# Patient Record
Sex: Male | Born: 1985
Health system: Southern US, Community
[De-identification: ages and names within clinical notes are randomized; demographics above are authoritative.]

## PROBLEM LIST (undated history)

## (undated) DIAGNOSIS — K219 Gastro-esophageal reflux disease without esophagitis: Secondary | ICD-10-CM

## (undated) HISTORY — DX: Gastro-esophageal reflux disease without esophagitis: K21.9

---
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
## 2018-07-15 ENCOUNTER — Ambulatory Visit (HOSPITAL_COMMUNITY)
Admission: EM | Admit: 2018-07-15 | Discharge: 2018-07-15 | Disposition: A | Discharge status: Home / Self Care | Payer: Commercial Managed Care - PPO | Attending: Family | Admitting: Family

## 2018-07-15 ENCOUNTER — Encounter (HOSPITAL_COMMUNITY): Payer: Self-pay | Admitting: Emergency Medicine

## 2018-07-15 ENCOUNTER — Ambulatory Visit (INDEPENDENT_AMBULATORY_CARE_PROVIDER_SITE_OTHER): Payer: Commercial Managed Care - PPO

## 2018-07-15 DIAGNOSIS — S92355A Nondisplaced fracture of fifth metatarsal bone, left foot, initial encounter for closed fracture: Secondary | ICD-10-CM

## 2018-07-15 DIAGNOSIS — Y92838 Other recreation area as the place of occurrence of the external cause: Secondary | ICD-10-CM | POA: Diagnosis not present

## 2018-07-15 NOTE — Discharge Instructions (Addendum)
Recommend keep foot elevated and wear cam walker boot for support. May continue Ibuprofen 600mg  every 6 hours as needed for pain and swelling. Call your Podiatrist tomorrow AM to schedule appointment for follow-up or Call [facility] if unable to contact your Podiatrist for further evaluation and treatment.

## 2018-07-15 NOTE — ED Provider Notes (Signed)
[facility]    CSN: [id number] Arrival date & time: 07/15/18  1648     History   Chief Complaint Chief Complaint  Patient presents with  . Foot Pain    HPI Eric Espinoza is a 32 y.o. male.   32 year old male presents with injury to his left foot today. He was playing with his daughter at the playground when he rolled his left foot and ankle. He experienced some pain but noticed a few hours later that he has more swelling on the lateral area of his foot and concerned over possibility of fracture. He has applied ice and taken Ibuprofen with minimal relief. Denies any numbness or previous injury to area. No other chronic health issues. Takes no daily medication.   The history is provided by the patient.    History reviewed. No pertinent past medical history.  There are no active problems to display for this patient.   History reviewed. No pertinent surgical history.     Home Medications    Prior to Admission medications   Not on File    Family History History reviewed. No pertinent family history.  Social History Social History   Tobacco Use  . Smoking status: Never Smoker  . Smokeless tobacco: Never Used  Substance Use Topics  . Alcohol use: Not Currently  . Drug use: Not Currently     Allergies   Patient has no known allergies.   Review of Systems Review of Systems  Constitutional: Negative for activity change, chills, fatigue and fever.  Cardiovascular: Negative for chest pain and leg swelling.  Gastrointestinal: Negative for nausea and vomiting.  Musculoskeletal: Positive for arthralgias, gait problem and joint swelling.  Skin: Positive for color change. Negative for rash and wound.  Allergic/Immunologic: Negative for immunocompromised state.  Neurological: Negative for dizziness, tremors, syncope, weakness, light-headedness, numbness and headaches.  Hematological: Negative for adenopathy. Does not bruise/bleed easily.    Psychiatric/Behavioral: Negative.      Physical Exam Triage Vital Signs ED Triage Vitals  Enc Vitals Group     BP 07/15/18 1727 128/72     Pulse Rate 07/15/18 1727 86     Resp 07/15/18 1727 18     Temp 07/15/18 1727 (!) 97.2 F (36.2 C)     Temp Source 07/15/18 1727 Oral     SpO2 07/15/18 1727 99 %     Weight --      Height --      Head Circumference --      Peak Flow --      Pain Score 07/15/18 1728 5     Pain Loc --      Pain Edu? --      Excl. in GC? --    No data found.  Updated Vital Signs BP 128/72 (BP Location: Left Arm)   Pulse 86   Temp (!) 97.2 F (36.2 C) (Oral)   Resp 18   SpO2 99%   Visual Acuity Right Eye Distance:   Left Eye Distance:   Bilateral Distance:    Right Eye Near:   Left Eye Near:    Bilateral Near:     Physical Exam Vitals signs reviewed.  Constitutional:      General: He is not in acute distress.    Appearance: Normal appearance. He is well-developed, well-groomed and normal weight. He is not ill-appearing.  HENT:     Head: Normocephalic and atraumatic.  Eyes:     Extraocular Movements: Extraocular movements intact.  Conjunctiva/sclera: Conjunctivae normal.  Neck:     Musculoskeletal: Normal range of motion.  Cardiovascular:     Rate and Rhythm: Normal rate.  Pulmonary:     Effort: Pulmonary effort is normal.  Musculoskeletal:        General: Swelling, tenderness and signs of injury present.     Left foot: Normal range of motion and normal capillary refill. Tenderness and swelling present. No deformity or laceration.       Feet:     Comments: Has full range of motion of left foot and ankle but increased pain with internal rotation. Swelling present along the 4th and 5th metatarsal area of the left foot. Slight bruising present. Tender. No numbness or neuro deficits noted. Good capillary refill. No injury to ankle noted.   Skin:    General: Skin is warm and dry.     Capillary Refill: Capillary refill takes less than 2  seconds.     Findings: Bruising and signs of injury present. No laceration or wound.  Neurological:     General: No focal deficit present.     Mental Status: He is alert and oriented to person, place, and time.     Sensory: Sensation is intact. No sensory deficit.     Motor: Motor function is intact. No weakness.     Gait: Gait abnormal (difficulty bearing weight due to injury).  Psychiatric:        Mood and Affect: Mood normal.        Behavior: Behavior normal. Behavior is cooperative.        Thought Content: Thought content normal.        Judgment: Judgment normal.      UC Treatments / Results  Labs (all labs ordered are listed, but only abnormal results are displayed) Labs Reviewed - No data to display  EKG None  Radiology Dg Foot Complete Left  Result Date: 07/15/2018 CLINICAL DATA:  Left lateral foot pain, swelling EXAM: LEFT FOOT - COMPLETE 3+ VIEW COMPARISON:  None. FINDINGS: Nondisplaced fracture at the base of the left 5th metatarsal. No additional fracture. No subluxation or dislocation. IMPRESSION: Nondisplaced fracture at the base of the left 5th metatarsal. Electronically Signed   By: Charlett NoseKevin  Dover M.D.   On: 07/15/2018 18:02    Procedures Procedures (including critical care time)  Medications Ordered in UC Medications - No data to display  Initial Impression / Assessment and Plan / UC Course  I have reviewed the triage vital signs and the nursing notes.  Pertinent labs & imaging results that were available during my care of the patient were reviewed by me and considered in my medical decision making (see chart for details).    Reviewed x-ray results with patient- non-displaced fracture at base of 5th metatarsal. Discussed probable treatment. Recommend Cam walker boot for support. May continue Ibuprofen 600mg  every 6 hours as needed for pain and swelling. Encouraged to elevate foot as much as possible. Call his Podiatrist tomorrow (Monday) to schedule  appointment for follow-up. If unable to see his Podiatrist, may contact [facility] for further evaluation.  Final Clinical Impressions(s) / UC Diagnoses   Final diagnoses:  Nondisplaced fracture of fifth metatarsal bone, left foot, initial encounter for closed fracture     Discharge Instructions     Recommend keep foot elevated and wear cam walker boot for support. May continue Ibuprofen 600mg  every 6 hours as needed for pain and swelling. Call your Podiatrist tomorrow AM to schedule appointment  for follow-up or Call [facility] if unable to contact your Podiatrist for further evaluation and treatment.     ED Prescriptions    None     Controlled Substance Prescriptions [place] Controlled Substance Registry consulted? Not Applicable   Sudie Grumbling, NP 07/15/18 2215

## 2018-07-15 NOTE — ED Triage Notes (Signed)
Pt here for left foot pain after twisting ankle today

## 2019-07-18 ENCOUNTER — Other Ambulatory Visit: Payer: Self-pay

## 2019-07-19 ENCOUNTER — Encounter: Payer: Self-pay | Admitting: Family Medicine

## 2019-07-19 ENCOUNTER — Ambulatory Visit (INDEPENDENT_AMBULATORY_CARE_PROVIDER_SITE_OTHER): Payer: Commercial Managed Care - PPO | Admitting: Family Medicine

## 2019-07-19 ENCOUNTER — Ambulatory Visit: Payer: Commercial Managed Care - PPO | Admitting: Family Medicine

## 2019-07-19 VITALS — BP 120/78 | HR 95 | Temp 98.7°F | Ht 70.0 in | Wt 166.6 lb

## 2019-07-19 DIAGNOSIS — Z8042 Family history of malignant neoplasm of prostate: Secondary | ICD-10-CM | POA: Diagnosis not present

## 2019-07-19 DIAGNOSIS — G479 Sleep disorder, unspecified: Secondary | ICD-10-CM

## 2019-07-19 DIAGNOSIS — Z0000 Encounter for general adult medical examination without abnormal findings: Secondary | ICD-10-CM

## 2019-07-19 LAB — LIPID PANEL
Cholesterol: 126 mg/dL (ref 0–200)
HDL: 40.9 mg/dL (ref >39.00)
LDL Cholesterol: 71 mg/dL (ref 0–99)
NonHDL: 85.2
Total CHOL/HDL Ratio: 3
Triglycerides: 73 mg/dL (ref 0.0–149.0)
VLDL: 14.6 mg/dL (ref 0.0–40.0)

## 2019-07-19 LAB — COMPREHENSIVE METABOLIC PANEL
ALT: 16 U/L (ref 0–53)
AST: 24 U/L (ref 0–37)
Albumin: 4.6 g/dL (ref 3.5–5.2)
Alkaline Phosphatase: 68 U/L (ref 39–117)
BUN: 15 mg/dL (ref 6–23)
CO2: 31 mEq/L (ref 19–32)
Calcium: 10 mg/dL (ref 8.4–10.5)
Chloride: 103 mEq/L (ref 96–112)
Creatinine, Ser: 0.78 mg/dL (ref 0.40–1.50)
GFR: 114.35 mL/min (ref >60.00)
Glucose, Bld: 84 mg/dL (ref 70–99)
Potassium: 4.4 mEq/L (ref 3.5–5.1)
Sodium: 139 mEq/L (ref 135–145)
Total Bilirubin: 0.4 mg/dL (ref 0.2–1.2)
Total Protein: 7 g/dL (ref 6.0–8.3)

## 2019-07-19 LAB — CBC WITH DIFFERENTIAL/PLATELET
Basophils Absolute: 0 10*3/uL (ref 0.0–0.1)
Basophils Relative: 0.7 % (ref 0.0–3.0)
Eosinophils Absolute: 0.2 10*3/uL (ref 0.0–0.7)
Eosinophils Relative: 2.7 % (ref 0.0–5.0)
HCT: 41.2 % (ref 39.0–52.0)
Hemoglobin: 14.4 g/dL (ref 13.0–17.0)
Lymphocytes Relative: 32.9 % (ref 12.0–46.0)
Lymphs Abs: 1.9 10*3/uL (ref 0.7–4.0)
MCHC: 34.9 g/dL (ref 30.0–36.0)
MCV: 86.8 fl (ref 78.0–100.0)
Monocytes Absolute: 0.5 10*3/uL (ref 0.1–1.0)
Monocytes Relative: 9.2 % (ref 3.0–12.0)
Neutro Abs: 3.2 10*3/uL (ref 1.4–7.7)
Neutrophils Relative %: 54.5 % (ref 43.0–77.0)
Platelets: 186 10*3/uL (ref 150.0–400.0)
RBC: 4.75 Mil/uL (ref 4.22–5.81)
RDW: 12.7 % (ref 11.5–15.5)
WBC: 5.8 10*3/uL (ref 4.0–10.5)

## 2019-07-19 LAB — TSH: TSH: 1.81 u[IU]/mL (ref 0.35–4.50)

## 2019-07-19 NOTE — Progress Notes (Signed)
Subjective  CC:  Chief Complaint  Patient presents with  . New Patient (Initial Visit)    sleep problems    HPI: Eric Espinoza is a 33 y.o. male who presents to [facility] [place] today to establish care with me as a new patient.   He has the following concerns or needs:  Very pleasant healthy married father of a 50 yo daughter here to establish care. Had a complete physical last year - no medical problems or medications. Lives healthy lifestyle. Over last 2 weeks, will awaken with a jerk and will have a problem getting back to sleep. No chronic sleep problems. Had similar short term issue back in may. Denies stressors. Will lie awake at times worrying about if he will fall asleep soon. + snoring, no witnessed apnea. Normal mood. Happy.   Assessment  1. Annual physical exam   2. Sleep difficulties   3. Family history of prostate cancer      Plan   CPE: routine guidance given. Screening labs ordered. imms up to date.   Counseled on sleep hygiene and mgt strategies. F/u if worsens or persists.   Follow up:  Return in about 1 year (around 07/18/2020) for complete physical. Orders Placed This Encounter  Procedures  . CBC w/Diff  . CMP  . Lipids  . TSH  . HIV antibody   No orders of the defined types were placed in this encounter.    Depression screen [facility] 2/9 07/19/2019  Decreased Interest 0  Down, Depressed, Hopeless 0  PHQ - 2 Score 0  Altered sleeping 1  Tired, decreased energy 1  Change in appetite 0  Feeling bad or failure about yourself  0  Trouble concentrating 0  Moving slowly or fidgety/restless 0  Suicidal thoughts 0  PHQ-9 Score 2  Difficult doing work/chores Not difficult at all    We updated and reviewed the patient's past history in detail and it is documented below.  Patient Active Problem List   Diagnosis Date Noted  . Family history of prostate cancer 07/22/2019    Dad and uncle and Grandfather    Health Maintenance    Topic Date Due  . Samul Dada  07/18/2020 (Originally 02/22/2005)  . INFLUENZA VACCINE  Completed  . HIV Screening  Completed   Immunization History  Administered Date(s) Administered  . Influenza Inj Mdck Quad With Preservative 05/27/2019   No outpatient medications have been marked as taking for the 07/19/19 encounter (Office Visit) with Leamon Arnt, MD.    Allergies: Patient has No Known Allergies. Past Medical History Patient  has a past medical history of GERD (gastroesophageal reflux disease). Past Surgical History Patient  has a past surgical history that includes Appendectomy (1999). Family History: Patient family history includes Depression in his sister; Diabetes in his paternal grandfather; Healthy in his daughter; Hearing loss in his paternal grandfather; Heart disease in his paternal grandfather; High blood pressure in his paternal grandfather; Liver cancer in his mother; Mental illness in his paternal grandfather; Prostate cancer in his father, paternal grandfather, and paternal uncle. Social History:  Patient  reports that he has never smoked. He has never used smokeless tobacco. He reports previous alcohol use. He reports that he does not use drugs.  Review of Systems: Constitutional: negative for fever or malaise Ophthalmic: negative for photophobia, double vision or loss of vision Cardiovascular: negative for chest pain, dyspnea on exertion, or new LE swelling Respiratory: negative for SOB or persistent cough  Gastrointestinal: negative for abdominal pain, change in bowel habits or melena Genitourinary: negative for dysuria or gross hematuria Musculoskeletal: negative for new gait disturbance or muscular weakness Integumentary: negative for new or persistent rashes Neurological: negative for TIA or stroke symptoms Psychiatric: negative for SI or delusions Allergic/Immunologic: negative for hives  Patient Care Team    Relationship Specialty Notifications  Start End  Willow Ora, MD PCP - General Family Medicine  07/19/19     Objective  Vitals: BP 120/78 (BP Location: Right Arm, Patient Position: Sitting, Cuff Size: Normal)   Pulse 95   Temp 98.7 F (37.1 C) (Temporal)   Ht 5\' 10"  (1.778 m)   Wt 166 lb 9.6 oz (75.6 kg)   SpO2 98%   BMI 23.90 kg/m  General:  Well developed, well nourished, no acute distress  Psych:  Alert and oriented,normal mood and affect HEENT:  Normocephalic, atraumatic, non-icteric sclera, PERRL, oropharynx is without mass or exudate, supple neck without adenopathy, mass or thyromegaly Cardiovascular:  RRR without gallop, rub or murmur, nondisplaced PMI Respiratory:  Good breath sounds bilaterally, CTAB with normal respiratory effort Gastrointestinal: normal bowel sounds, soft, non-tender, no noted masses. No HSM MSK: no deformities, contusions. Joints are without erythema or swelling Skin:  Warm, no rashes or suspicious lesions noted Neurologic:    Mental status is normal. Gross motor and sensory exams are normal. Normal gait   Commons side effects, risks, benefits, and alternatives for medications and treatment plan prescribed today were discussed, and the patient expressed understanding of the given instructions. Patient is instructed to call or message via MyChart if he/she has any questions or concerns regarding our treatment plan. No barriers to understanding were identified. We discussed Red Flag symptoms and signs in detail. Patient expressed understanding regarding what to do in case of urgent or emergency type symptoms.   Medication list was reconciled, printed and provided to the patient in AVS. Patient instructions and summary information was reviewed with the patient as documented in the AVS. This note was prepared with assistance of Dragon voice recognition software. Occasional wrong-word or sound-a-like substitutions may have occurred due to the inherent limitations of voice recognition  software  This visit occurred during the SARS-CoV-2 public health emergency.  Safety protocols were in place, including screening questions prior to the visit, additional usage of staff PPE, and extensive cleaning of exam room while observing appropriate contact time as indicated for disinfecting solutions.

## 2019-07-19 NOTE — Patient Instructions (Signed)
Please return in 12 months for your annual complete physical; please come fasting.  Please sign up for Mychart. I have sent you a link via text. I will release your lab results to you on your MyChart account with further instructions. Please reply with any questions.    It was a pleasure meeting you today! Thank you for choosing [place] to meet your healthcare needs! I truly look forward to working with you. If you have any questions or concerns, please send me a message via Mychart or call the office at [phone].  Please do these things to maintain good health!   Exercise at least 30-45 minutes a day,  4-5 days a week.   Eat a low-fat diet with lots of fruits and vegetables, up to 7-9 servings per day.  Drink plenty of water daily. Try to drink 8 8oz glasses per day.  Seatbelts can save your life. Always wear your seatbelt.  Place Smoke Detectors on every level of your home and check batteries every year.  Eye Doctor - have an eye exam every 1-2 years  Safe sex - use condoms to protect yourself from STDs if you could be exposed to these types of infections.  Avoid heavy alcohol use. If you drink, keep it to less than 2 drinks/day and not every day.  [place].  Choose someone you trust that could speak for you if you became unable to speak for yourself.  Depression is common in our stressful world.If you're feeling down or losing interest in things you normally enjoy, please come in for a visit.   Insomnia Insomnia is a sleep disorder that makes it difficult to fall asleep or stay asleep. Insomnia can cause fatigue, low energy, difficulty concentrating, mood swings, and poor performance at work or school. There are three different ways to classify insomnia:  Difficulty falling asleep.  Difficulty staying asleep.  Waking up too early in the morning. Any type of insomnia can be long-term (chronic) or short-term (acute). Both are common. Short-term insomnia  usually lasts for three months or less. Chronic insomnia occurs at least three times a week for longer than three months. What are the causes? Insomnia may be caused by another condition, situation, or substance, such as:  Anxiety.  Certain medicines.  Gastroesophageal reflux disease (GERD) or other gastrointestinal conditions.  Asthma or other breathing conditions.  Restless legs syndrome, sleep apnea, or other sleep disorders.  Chronic pain.  Menopause.  Stroke.  Abuse of alcohol, tobacco, or illegal drugs.  Mental health conditions, such as depression.  Caffeine.  Neurological disorders, such as Alzheimer's disease.  An overactive thyroid (hyperthyroidism). Sometimes, the cause of insomnia may not be known. What increases the risk? Risk factors for insomnia include:  Gender. Women are affected more often than men.  Age. Insomnia is more common as you get older.  Stress.  Lack of exercise.  Irregular work schedule or working night shifts.  Traveling between different time zones.  Certain medical and mental health conditions. What are the signs or symptoms? If you have insomnia, the main symptom is having trouble falling asleep or having trouble staying asleep. This may lead to other symptoms, such as:  Feeling fatigued or having low energy.  Feeling nervous about going to sleep.  Not feeling rested in the morning.  Having trouble concentrating.  Feeling irritable, anxious, or depressed. How is this diagnosed? This condition may be diagnosed based on:  Your symptoms and medical history. Your health care  provider may ask about: ? Your sleep habits. ? Any medical conditions you have. ? Your mental health.  A physical exam. How is this treated? Treatment for insomnia depends on the cause. Treatment may focus on treating an underlying condition that is causing insomnia. Treatment may also include:  Medicines to help you sleep.  Counseling or  therapy.  Lifestyle adjustments to help you sleep better. Follow these instructions at home: Eating and drinking   Limit or avoid alcohol, caffeinated beverages, and cigarettes, especially close to bedtime. These can disrupt your sleep.  Do not eat a large meal or eat spicy foods right before bedtime. This can lead to digestive discomfort that can make it hard for you to sleep. Sleep habits   Keep a sleep diary to help you and your health care provider figure out what could be causing your insomnia. Write down: ? When you sleep. ? When you wake up during the night. ? How well you sleep. ? How rested you feel the next day. ? Any side effects of medicines you are taking. ? What you eat and drink.  Make your bedroom a dark, comfortable place where it is easy to fall asleep. ? Put up shades or blackout curtains to block light from outside. ? Use a white noise machine to block noise. ? Keep the temperature cool.  Limit screen use before bedtime. This includes: ? Watching TV. ? Using your smartphone, tablet, or computer.  Stick to a routine that includes going to bed and waking up at the same times every day and night. This can help you fall asleep faster. Consider making a quiet activity, such as reading, part of your nighttime routine.  Try to avoid taking naps during the day so that you sleep better at night.  Get out of bed if you are still awake after 15 minutes of trying to sleep. Keep the lights down, but try reading or doing a quiet activity. When you feel sleepy, go back to bed. General instructions  Take over-the-counter and prescription medicines only as told by your health care provider.  Exercise regularly, as told by your health care provider. Avoid exercise starting several hours before bedtime.  Use relaxation techniques to manage stress. Ask your health care provider to suggest some techniques that may work well for you. These may include: ? Breathing  exercises. ? Routines to release muscle tension. ? Visualizing peaceful scenes.  Make sure that you drive carefully. Avoid driving if you feel very sleepy.  Keep all follow-up visits as told by your health care provider. This is important. Contact a health care provider if:  You are tired throughout the day.  You have trouble in your daily routine due to sleepiness.  You continue to have sleep problems, or your sleep problems get worse. Get help right away if:  You have serious thoughts about hurting yourself or someone else. If you ever feel like you may hurt yourself or others, or have thoughts about taking your own life, get help right away. You can go to your nearest emergency department or call:  Your local emergency services (911 in the [place]  A suicide crisis helpline, such as the National Suicide Prevention Lifeline at 305-034-38491-936-629-4974. This is open 24 hours a day. Summary  Insomnia is a sleep disorder that makes it difficult to fall asleep or stay asleep.  Insomnia can be long-term (chronic) or short-term (acute).  Treatment for insomnia depends on the cause. Treatment may focus on treating an  underlying condition that is causing insomnia.  Keep a sleep diary to help you and your health care provider figure out what could be causing your insomnia. This information is not intended to replace advice given to you by your health care provider. Make sure you discuss any questions you have with your health care provider. Document Released: 07/15/2000 Document Revised: 06/30/2017 Document Reviewed: 04/27/2017 Elsevier Patient Education  2020 [employer].

## 2019-07-20 LAB — HIV ANTIBODY (ROUTINE TESTING W REFLEX): HIV 1&2 Ab, 4th Generation: NONREACTIVE

## 2019-07-22 ENCOUNTER — Encounter: Payer: Self-pay | Admitting: Family Medicine

## 2019-07-22 DIAGNOSIS — Z8042 Family history of malignant neoplasm of prostate: Secondary | ICD-10-CM | POA: Insufficient documentation

## 2019-07-22 NOTE — Progress Notes (Signed)
I have reviewed results. Normal. Patient notified by letter. Please see letter for details. 

## 2019-07-22 NOTE — Progress Notes (Signed)
Lab results mailed to patient in letter. Normal results. No action / follow up needed on these results.  

## 2019-09-18 ENCOUNTER — Ambulatory Visit: Payer: Commercial Managed Care - PPO | Admitting: Family Medicine

## 2019-10-09 ENCOUNTER — Encounter: Payer: Self-pay | Admitting: Family Medicine

## 2019-10-21 ENCOUNTER — Other Ambulatory Visit: Payer: Self-pay

## 2019-10-21 ENCOUNTER — Ambulatory Visit (INDEPENDENT_AMBULATORY_CARE_PROVIDER_SITE_OTHER): Payer: Commercial Managed Care - PPO | Admitting: Family Medicine

## 2019-10-21 ENCOUNTER — Encounter: Payer: Self-pay | Admitting: Family Medicine

## 2019-10-21 VITALS — BP 122/78 | HR 68 | Temp 98.0°F | Ht 70.0 in | Wt 164.4 lb

## 2019-10-21 DIAGNOSIS — M79642 Pain in left hand: Secondary | ICD-10-CM

## 2019-10-21 DIAGNOSIS — M79641 Pain in right hand: Secondary | ICD-10-CM | POA: Diagnosis not present

## 2019-10-21 DIAGNOSIS — G5621 Lesion of ulnar nerve, right upper limb: Secondary | ICD-10-CM

## 2019-10-21 NOTE — Patient Instructions (Signed)
Please follow up if symptoms do not improve or as needed.   

## 2019-10-21 NOTE — Progress Notes (Signed)
Subjective  CC:  Chief Complaint  Patient presents with  . Wrist Pain    bilateral wrist pain. chronic problem. flare up    HPI: Eric Espinoza is a 34 y.o. male who presents to the office today to address the problems listed above in the chief complaint.  34 yo male who works at [employer] on computer a lot c/o > 10 years of hand and wrist pain. He has seen several providers in the past but seems like mostly primary care or occupational med. Reports dx of "tendonitis". Has hypothenar tenderness, intermittent pain into index finger and rare but recurrent numbness in the fifth pinky. He was prescribed large wrist splints years ago and wears them daily while working! No nighttime sxs. He admits to intermittent medial elbow pain as well. He has used tylenol, advil, and stretches. 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 No prior injuries. No joint pain or swelling or redness or warmth. No new activities or overuse outside of typing on computer. Uses regular mouse and keyboard. Did note ttp on palm yesterday when pushing his daughter in the swing. No thumb pain. No other joint pain. sxs worse over last 2 weeks. Reports has had xrays in past that were unremarkable. Has never seen hand specialist or ortho or sports med.    Assessment  1. Bilateral hand pain   2. Ulnar neuropathy at elbow of right upper extremity      Plan   Hand pain:  Exam is most c/w mechanical soreness from keyboard and mouse use. No joint sxs or exam findings. Discussed adjusting ergonomics, getting a pad for hands and mouse (iMak bean bag pads), trial of advil and monitoring sxs more closely. Stop the braces as well. If not improving, then sports med or hand surgeon evaluation. No sxs of CTS. No inflammatory findings neither.   likley ulnar neuropathy causing intermittent pinky numbness. Decrease leaning on elbows. Further work up if not improving.   Follow up: prn  Visit date not found  No  orders of the defined types were placed in this encounter.  No orders of the defined types were placed in this encounter.     I reviewed the patients updated PMH, FH, and SocHx.    Patient Active Problem List   Diagnosis Date Noted  . Family history of prostate cancer 07/22/2019   No outpatient medications have been marked as taking for the 10/21/19 encounter (Office Visit) with Leamon Arnt, MD.    Allergies: Patient has No Known Allergies. Family History: Patient family history includes Depression in his sister; Diabetes in his paternal grandfather; Healthy in his daughter; Hearing loss in his paternal grandfather; Heart disease in his paternal grandfather; High blood pressure in his paternal grandfather; Liver cancer in his mother; Mental illness in his paternal grandfather; Prostate cancer in his father, paternal grandfather, and paternal uncle. Social History:  Patient  reports that he has never smoked. He has never used smokeless tobacco. He reports previous alcohol use. He reports that he does not use drugs.  Review of Systems: Constitutional: Negative for fever malaise or anorexia Cardiovascular: negative for chest pain Respiratory: negative for SOB or persistent cough Gastrointestinal: negative for abdominal pain  Objective  Vitals: BP 122/78 (BP Location: Right Arm, Patient Position: Sitting, Cuff Size: Normal)   Pulse 68   Temp 98 F (36.7 C) (Temporal)   Ht 5\' 10"  (1.778 m)   Wt 164 lb 6.4 oz (74.6  kg)   SpO2 97%   BMI 23.59 kg/m  General: no acute distress , A&Ox3 MSK: nl appearing joints and hands. Nl grip strength, interosseous, and wrist/forearms. ttp over bilateral hypothenar prominences w/o joint ttp. Neg finklestiens, neg phalens. Mild ttp over right medial epicondyle. No forearm mm ttp.    Commons side effects, risks, benefits, and alternatives for medications and treatment plan prescribed today were discussed, and the patient expressed understanding of  the given instructions. Patient is instructed to call or message via MyChart if he/she has any questions or concerns regarding our treatment plan. No barriers to understanding were identified. We discussed Red Flag symptoms and signs in detail. Patient expressed understanding regarding what to do in case of urgent or emergency type symptoms.   Medication list was reconciled, printed and provided to the patient in AVS. Patient instructions and summary information was reviewed with the patient as documented in the AVS. This note was prepared with assistance of Dragon voice recognition software. Occasional wrong-word or sound-a-like substitutions may have occurred due to the inherent limitations of voice recognition software  This visit occurred during the SARS-CoV-2 public health emergency.  Safety protocols were in place, including screening questions prior to the visit, additional usage of staff PPE, and extensive cleaning of exam room while observing appropriate contact time as indicated for disinfecting solutions.

## 2019-11-13 ENCOUNTER — Encounter: Payer: Self-pay | Admitting: Family Medicine

## 2020-03-01 ENCOUNTER — Encounter: Payer: Self-pay | Admitting: Family Medicine

## 2020-03-04 ENCOUNTER — Ambulatory Visit: Payer: Commercial Managed Care - PPO | Admitting: Family Medicine

## 2020-03-04 ENCOUNTER — Encounter: Payer: Self-pay | Admitting: Family Medicine

## 2020-03-04 ENCOUNTER — Other Ambulatory Visit: Payer: Self-pay

## 2020-03-04 VITALS — BP 114/80 | HR 87 | Temp 98.4°F | Resp 16 | Wt 166.0 lb

## 2020-03-04 DIAGNOSIS — M25572 Pain in left ankle and joints of left foot: Secondary | ICD-10-CM

## 2020-03-04 NOTE — Patient Instructions (Signed)
Please return in December 2021 for your physical.   Please go to our [facility] office to get your xrays done. You can walk in M-F between 8:30am- noon or 1pm - 5pm. Tell them you are there for xrays ordered by me. They will send me the results, then I will let you know the results with instructions.   Address: [address].  The Xray department is located in the basement.     If you have any questions or concerns, please don't hesitate to send me a message via MyChart or call the office at [phone]. Thank you for visiting with [place] today! It's our pleasure caring for you.

## 2020-03-04 NOTE — Progress Notes (Signed)
Subjective  CC:  Chief Complaint  Patient presents with  . Joint Swelling    x several months, denies any injuries    HPI: Eric Espinoza is a 34 y.o. male who presents to the office today to address the problems listed above in the chief complaint.  Healthy 34 year old male who was noticed tenderness to touch in his medial left ankle for the last 1 to 2 months.  He does not recall any injury.  No swelling, redness or bruising noted.  He is concerned about an broken bone.  He did break a foot for in the past and would like this checked out.  No pain with walking.  No other joint problems at this time. Assessment  1. Acute left ankle pain      Plan   Medial left ankle pain: Prominent malleolus, possible bony contusion.  Check x-ray to rule out other problems.  Reassured.  Trial of Tylenol or Advil.  Follow up: Return in about 4 months (around 07/04/2020) for complete physical.    Orders Placed This Encounter  Procedures  . DG Ankle Complete Left   No orders of the defined types were placed in this encounter.     I reviewed the patients updated PMH, FH, and SocHx.    Patient Active Problem List   Diagnosis Date Noted  . Family history of prostate cancer 07/22/2019   No outpatient medications have been marked as taking for the 03/04/20 encounter (Office Visit) with Willow Ora, MD.    Allergies: Patient has No Known Allergies. Family History: Patient family history includes Depression in his sister; Diabetes in his paternal grandfather; Healthy in his daughter; Hearing loss in his paternal grandfather; Heart disease in his paternal grandfather; High blood pressure in his paternal grandfather; Liver cancer in his mother; Mental illness in his paternal grandfather; Prostate cancer in his father, paternal grandfather, and paternal uncle. Social History:  Patient  reports that he has never smoked. He has never used smokeless tobacco. He reports previous alcohol use. He  reports that he does not use drugs.  Review of Systems: Constitutional: Negative for fever malaise or anorexia Cardiovascular: negative for chest pain Respiratory: negative for SOB or persistent cough Gastrointestinal: negative for abdominal pain  Objective  Vitals: BP 114/80   Pulse 87   Temp 98.4 F (36.9 C) (Temporal)   Resp 16   Wt 166 lb (75.3 kg)   SpO2 98%   BMI 23.82 kg/m  General: no acute distress , A&Ox3 Left ankle: Normal-appearing, medial malleolus is prominent and tender to pressure lateral or medial ligament tenderness, normal range of motion without pain.  Normal gait.  No ankle swelling.     Commons side effects, risks, benefits, and alternatives for medications and treatment plan prescribed today were discussed, and the patient expressed understanding of the given instructions. Patient is instructed to call or message via MyChart if he/she has any questions or concerns regarding our treatment plan. No barriers to understanding were identified. We discussed Red Flag symptoms and signs in detail. Patient expressed understanding regarding what to do in case of urgent or emergency type symptoms.   Medication list was reconciled, printed and provided to the patient in AVS. Patient instructions and summary information was reviewed with the patient as documented in the AVS. This note was prepared with assistance of Dragon voice recognition software. Occasional wrong-word or sound-a-like substitutions may have occurred due to the inherent limitations of voice recognition software  This visit occurred  during the SARS-CoV-2 public health emergency.  Safety protocols were in place, including screening questions prior to the visit, additional usage of staff PPE, and extensive cleaning of exam room while observing appropriate contact time as indicated for disinfecting solutions.

## 2020-03-27 ENCOUNTER — Ambulatory Visit (INDEPENDENT_AMBULATORY_CARE_PROVIDER_SITE_OTHER)
Admission: RE | Admit: 2020-03-27 | Discharge: 2020-03-27 | Disposition: A | Discharge status: Home / Self Care | Payer: Commercial Managed Care - PPO | Source: Ambulatory Visit | Attending: Family Medicine | Admitting: Family Medicine

## 2020-03-27 ENCOUNTER — Other Ambulatory Visit: Payer: Self-pay

## 2020-03-27 DIAGNOSIS — M25572 Pain in left ankle and joints of left foot: Secondary | ICD-10-CM

## 2020-04-29 ENCOUNTER — Ambulatory Visit: Payer: Commercial Managed Care - PPO | Admitting: Family Medicine

## 2020-06-17 ENCOUNTER — Emergency Department (HOSPITAL_COMMUNITY)
Admission: EM | Admit: 2020-06-17 | Discharge: 2020-06-18 | Disposition: A | Discharge status: Home / Self Care | Payer: Commercial Managed Care - PPO | Attending: Emergency Medicine | Admitting: Emergency Medicine

## 2020-06-17 ENCOUNTER — Encounter: Payer: Self-pay | Admitting: Family Medicine

## 2020-06-17 ENCOUNTER — Emergency Department (HOSPITAL_COMMUNITY): Payer: Commercial Managed Care - PPO

## 2020-06-17 ENCOUNTER — Other Ambulatory Visit: Payer: Self-pay

## 2020-06-17 ENCOUNTER — Encounter (HOSPITAL_COMMUNITY): Payer: Self-pay | Admitting: *Deleted

## 2020-06-17 DIAGNOSIS — M79675 Pain in left toe(s): Secondary | ICD-10-CM | POA: Diagnosis not present

## 2020-06-17 DIAGNOSIS — W228XXA Striking against or struck by other objects, initial encounter: Secondary | ICD-10-CM | POA: Insufficient documentation

## 2020-06-17 DIAGNOSIS — Y9389 Activity, other specified: Secondary | ICD-10-CM | POA: Diagnosis not present

## 2020-06-17 NOTE — ED Triage Notes (Signed)
Pain in left 5th toe after bumping into something tonight.

## 2020-06-18 NOTE — ED Notes (Signed)
Patient verbalizes understanding of discharge instructions. Opportunity for questioning and answers were provided. Armband removed by staff, pt discharged from ED stable & ambulatory  

## 2020-06-18 NOTE — ED Provider Notes (Signed)
[facility] EMERGENCY DEPARTMENT Provider Note   CSN: [id number] Arrival date & time: 06/17/20  2155     History Chief Complaint  Patient presents with  . Toe Pain    Eric Espinoza is a 34 y.o. male.  The history is provided by the patient.  Toe Pain This is a new problem. The current episode started 3 to 5 hours ago. The problem occurs constantly. The problem has not changed since onset.The symptoms are aggravated by walking. The symptoms are relieved by rest.   Patient bumped his toe while playing with his daughter.  Reports his left fifth toe hurts    Past Medical History:  Diagnosis Date  . GERD (gastroesophageal reflux disease)     Patient Active Problem List   Diagnosis Date Noted  . Family history of prostate cancer 07/22/2019    Past Surgical History:  Procedure Laterality Date  . APPENDECTOMY  1999       Family History  Problem Relation Age of Onset  . Liver cancer Mother   . Prostate cancer Father   . Depression Sister   . Prostate cancer Paternal Grandfather   . Diabetes Paternal Grandfather   . Hearing loss Paternal Grandfather   . Heart disease Paternal Grandfather   . High blood pressure Paternal Grandfather   . Mental illness Paternal Grandfather   . Healthy Daughter   . Prostate cancer Paternal Uncle     Social History   Tobacco Use  . Smoking status: Never Smoker  . Smokeless tobacco: Never Used  Vaping Use  . Vaping Use: Never used  Substance Use Topics  . Alcohol use: Not Currently  . Drug use: Never    Home Medications Prior to Admission medications   Not on File    Allergies    Patient has no known allergies.  Review of Systems   Review of Systems  Constitutional: Negative for fever.  Musculoskeletal: Positive for arthralgias and joint swelling.    Physical Exam Updated Vital Signs BP 136/80 (BP Location: Right Arm)   Pulse 81   Temp 97.8 F (36.6 C) (Oral)   Resp 16   SpO2 98%   Physical  Exam CONSTITUTIONAL: Well developed/well nourished HEAD: Normocephalic/atraumatic EYES: EOMI NECK: supple no meningeal signs LUNGS:  no apparent distress ABDOMEN: soft NEURO: Pt is awake/alert/appropriate, moves all extremitiesx4.  No facial droop.   EXTREMITIES: pulses normal/equal, full ROM, tenderness and swelling noted to left fifth toe.  No lacerations.  Nailbed is intact without subungual hematoma.  No obvious deformities.  No wounds noted to plantar surface of the foot. SKIN: warm, color normal PSYCH: no abnormalities of mood noted, alert and oriented to situation  ED Results / Procedures / Treatments   Labs (all labs ordered are listed, but only abnormal results are displayed) Labs Reviewed - No data to display  EKG None  Radiology DG Toe 5th Left  Result Date: 06/18/2020 CLINICAL DATA:  34 year old male status post left 5th toe injury. EXAM: DG TOE 5TH LEFT COMPARISON:  None. FINDINGS: Generalized soft tissue swelling. Underlying normal bone mineralization. Alignment is preserved in the left 5th toe. The 5th MTP joint is normal. Possible tiny ossific fragment along the lateral aspect of the 5th PIP joint which might reflect a tiny periarticular fracture. But otherwise the 5th phalanges are intact. IMPRESSION: Questionable tiny fracture fragment along the lateral 5th PIP, but otherwise intact left 5th toe phalanges. Electronically Signed   By: Althea Grimmer.D.  On: 06/18/2020 01:48    Procedures Procedures    Medications Ordered in ED Medications - No data to display  ED Course  I have reviewed the triage vital signs and the nursing notes.  Pertinent   imaging results that were available during my care of the patient were reviewed by me and considered in my medical decision making (see chart for details).    MDM Rules/Calculators/A&P                          Patient decided to leave the hospital prior to final results of x-ray.  Buddy tape was placed and patient was  given postop shoe and referred to orthopedics Final Clinical Impression(s) / ED Diagnoses Final diagnoses:  Toe pain, left    Rx / DC Orders ED Discharge Orders    None       Zadie Rhine, MD 06/18/20 [phone]

## 2020-09-01 ENCOUNTER — Other Ambulatory Visit: Payer: Self-pay

## 2020-09-01 ENCOUNTER — Ambulatory Visit (INDEPENDENT_AMBULATORY_CARE_PROVIDER_SITE_OTHER): Payer: Commercial Managed Care - PPO | Admitting: Family Medicine

## 2020-09-01 ENCOUNTER — Encounter: Payer: Self-pay | Admitting: Family Medicine

## 2020-09-01 VITALS — BP 114/68 | HR 74 | Temp 98.4°F | Resp 16 | Ht 70.0 in | Wt 166.0 lb

## 2020-09-01 DIAGNOSIS — Z1159 Encounter for screening for other viral diseases: Secondary | ICD-10-CM

## 2020-09-01 DIAGNOSIS — Z0000 Encounter for general adult medical examination without abnormal findings: Secondary | ICD-10-CM | POA: Diagnosis not present

## 2020-09-01 LAB — LIPID PANEL
Cholesterol: 147 mg/dL (ref 0–200)
HDL: 44.5 mg/dL (ref >39.00)
LDL Cholesterol: 78 mg/dL (ref 0–99)
NonHDL: 102.03
Total CHOL/HDL Ratio: 3
Triglycerides: 118 mg/dL (ref 0.0–149.0)
VLDL: 23.6 mg/dL (ref 0.0–40.0)

## 2020-09-01 LAB — CBC WITH DIFFERENTIAL/PLATELET
Basophils Absolute: 0 10*3/uL (ref 0.0–0.1)
Basophils Relative: 0.3 % (ref 0.0–3.0)
Eosinophils Absolute: 0.2 10*3/uL (ref 0.0–0.7)
Eosinophils Relative: 4 % (ref 0.0–5.0)
HCT: 42.1 % (ref 39.0–52.0)
Hemoglobin: 14.7 g/dL (ref 13.0–17.0)
Lymphocytes Relative: 33.5 % (ref 12.0–46.0)
Lymphs Abs: 1.5 10*3/uL (ref 0.7–4.0)
MCHC: 35 g/dL (ref 30.0–36.0)
MCV: 86.3 fl (ref 78.0–100.0)
Monocytes Absolute: 0.4 10*3/uL (ref 0.1–1.0)
Monocytes Relative: 8.7 % (ref 3.0–12.0)
Neutro Abs: 2.4 10*3/uL (ref 1.4–7.7)
Neutrophils Relative %: 53.5 % (ref 43.0–77.0)
Platelets: 178 10*3/uL (ref 150.0–400.0)
RBC: 4.88 Mil/uL (ref 4.22–5.81)
RDW: 13.1 % (ref 11.5–15.5)
WBC: 4.4 10*3/uL (ref 4.0–10.5)

## 2020-09-01 LAB — COMPREHENSIVE METABOLIC PANEL
ALT: 18 U/L (ref 0–53)
AST: 24 U/L (ref 0–37)
Albumin: 4.6 g/dL (ref 3.5–5.2)
Alkaline Phosphatase: 65 U/L (ref 39–117)
BUN: 16 mg/dL (ref 6–23)
CO2: 32 mEq/L (ref 19–32)
Calcium: 9.7 mg/dL (ref 8.4–10.5)
Chloride: 103 mEq/L (ref 96–112)
Creatinine, Ser: 0.78 mg/dL (ref 0.40–1.50)
GFR: 116.34 mL/min (ref >60.00)
Glucose, Bld: 81 mg/dL (ref 70–99)
Potassium: 4.1 mEq/L (ref 3.5–5.1)
Sodium: 138 mEq/L (ref 135–145)
Total Bilirubin: 0.6 mg/dL (ref 0.2–1.2)
Total Protein: 7.4 g/dL (ref 6.0–8.3)

## 2020-09-01 LAB — HEPATITIS C ANTIBODY
Hepatitis C Ab: NONREACTIVE
SIGNAL TO CUT-OFF: 0.01 (ref <1.00)

## 2020-09-01 NOTE — Progress Notes (Signed)
Subjective  Chief Complaint  Patient presents with  . Annual Exam    Non-fasting  . Health Maintenance    Declines Tdap at this time   HPI: Eric Espinoza is a 35 y.o. male who presents to [facility] [place] today for a Male Wellness Visit.   Wellness Visit: annual visit with health maintenance review and exam    Very healthy 35 year old male, married, 17-year-old daughter who is doing well.  No new concerns.  Educational hand pain discussed earlier and Voltaren topical gel helps.  He is due for his Tdap but will defer until Friday because he has had some myalgias in the past and would like to have it on a weekend.  Body mass index is 23.82 kg/m. Wt Readings from Last 3 Encounters:  09/01/20 166 lb (75.3 kg)  03/04/20 166 lb (75.3 kg)  10/21/19 164 lb 6.4 oz (74.6 kg)    Patient Active Problem List   Diagnosis Date Noted  . Family history of prostate cancer 07/22/2019   Health Maintenance  Topic Date Due  . Hepatitis C Screening  Never done  . TETANUS/TDAP  09/25/2020 (Originally 02/22/2005)  . INFLUENZA VACCINE  Completed  . COVID-19 Vaccine  Completed  . HIV Screening  Completed   Immunization History  Administered Date(s) Administered  . Influenza Inj Mdck Quad With Preservative 05/27/2019  . Influenza-Unspecified 06/01/2020  . PFIZER(Purple Top)SARS-COV-2 Vaccination 10/30/2019, 11/20/2019, 07/15/2020   We updated and reviewed the patient's past history in detail and it is documented below. Allergies: Patient has No Known Allergies. Past Medical History  has a past medical history of GERD (gastroesophageal reflux disease). Past Surgical History  has a past surgical history that includes Appendectomy (1999). Social History Patient  reports that he has never smoked. He has never used smokeless tobacco. He reports previous alcohol use. He reports that he does not use drugs. Family History Patient family history includes Depression in his sister;  Diabetes in his paternal grandfather; Healthy in his daughter; Hearing loss in his paternal grandfather; Heart disease in his paternal grandfather; High blood pressure in his paternal grandfather; Liver cancer in his mother; Mental illness in his paternal grandfather; Prostate cancer in his father, paternal grandfather, and paternal uncle. Review of Systems: Constitutional: negative for fever or malaise Ophthalmic: negative for photophobia, double vision or loss of vision Cardiovascular: negative for chest pain, dyspnea on exertion, or new LE swelling Respiratory: negative for SOB or persistent cough Gastrointestinal: negative for abdominal pain, change in bowel habits or melena Genitourinary: negative for dysuria or gross hematuria Musculoskeletal: negative for new gait disturbance or muscular weakness Integumentary: negative for new or persistent rashes, no breast lumps Neurological: negative for TIA or stroke symptoms Psychiatric: negative for SI or delusions Allergic/Immunologic: negative for hives  Patient Care Team    Relationship Specialty Notifications Start End  Willow Ora, MD PCP - General Family Medicine  07/19/19    Objective  Vitals: BP 114/68   Pulse 74   Temp 98.4 F (36.9 C) (Temporal)   Resp 16   Ht 5\' 10"  (1.778 m)   Wt 166 lb (75.3 kg)   SpO2 98%   BMI 23.82 kg/m  General:  Well developed, well nourished, no acute distress  Psych:  Alert and orientedx3,normal mood and affect HEENT:  Normocephalic, atraumatic, non-icteric sclera, PERRL, oropharynx is clear without mass or exudate, supple neck without adenopathy, mass or thyromegaly Cardiovascular:  Normal S1, S2, RRR without gallop, rub or  murmur, nondisplaced PMI, +2 distal pulses in bilateral upper and lower extremities. Respiratory:  Good breath sounds bilaterally, CTAB with normal respiratory effort Gastrointestinal: normal bowel sounds, soft, non-tender, no noted masses. No HSM MSK: no deformities,  contusions. Joints are without erythema or swelling. Spine and CVA region are nontender Skin:  Warm, no rashes or suspicious lesions noted Neurologic:    Mental status is normal. CN 2-11 are normal. Gross motor and sensory exams are normal. Stable gait. No tremor GU: No inguinal hernias or adenopathy are appreciated bilaterally  Assessment  1. Annual physical exam   2. Need for hepatitis C screening test      Plan  Male Wellness Visit:  Age appropriate Health Maintenance and Prevention measures were discussed with patient. Included topics are cancer screening recommendations, ways to keep healthy (see AVS) including dietary and exercise recommendations, regular eye and dental care, use of seat belts, and avoidance of moderate alcohol use and tobacco use.   BMI: discussed patient's BMI and encouraged positive lifestyle modifications to help get to or maintain a target BMI.  HM needs and immunizations were addressed and ordered. See below for orders. See HM and immunization section for updates.  He will schedule nurse visit for Tdap  Routine labs and screening tests ordered including cmp, cbc and lipids where appropriate.  Discussed recommendations regarding Vit D and calcium supplementation (see AVS)  Follow up: Return in about 1 year (around 09/01/2021) for complete physical.   Commons side effects, risks, benefits, and alternatives for medications and treatment plan prescribed today were discussed, and the patient expressed understanding of the given instructions. Patient is instructed to call or message via MyChart if he/she has any questions or concerns regarding our treatment plan. No barriers to understanding were identified. We discussed Red Flag symptoms and signs in detail. Patient expressed understanding regarding what to do in case of urgent or emergency type symptoms.   Medication list was reconciled, printed and provided to the patient in AVS. Patient instructions and summary  information was reviewed with the patient as documented in the AVS. This note was prepared with assistance of Dragon voice recognition software. Occasional wrong-word or sound-a-like substitutions may have occurred due to the inherent limitations of voice recognition software This visit occurred during the SARS-CoV-2 public health emergency.  Safety protocols were in place, including screening questions prior to the visit, additional usage of staff PPE, and extensive cleaning of exam room while observing appropriate contact time as indicated for disinfecting solutions.   Orders Placed This Encounter  Procedures  . CBC with Differential/Platelet  . Comprehensive metabolic panel  . Lipid panel  . Hepatitis C antibody   No orders of the defined types were placed in this encounter.

## 2020-09-01 NOTE — Patient Instructions (Addendum)
Please return in 12 months for your annual complete physical; please come fasting. You may schedule a nurse visit to update your Tdap vaccine when you'd like on a Friday afternoon or when convenient.   I will release your lab results to you on your MyChart account with further instructions. Please reply with any questions.   If you have any questions or concerns, please don't hesitate to send me a message via MyChart or call the office at [phone]. Thank you for visiting with [place] today! It's our pleasure caring for you.  Please do these things to maintain good health!   Exercise at least 30-45 minutes a day,  4-5 days a week.   Eat a low-fat diet with lots of fruits and vegetables, up to 7-9 servings per day.  Drink plenty of water daily. Try to drink 8 8oz glasses per day.  Seatbelts can save your life. Always wear your seatbelt.  Place Smoke Detectors on every level of your home and check batteries every year.  Eye Doctor - have an eye exam every 1-2 years  Safe sex - use condoms to protect yourself from STDs if you could be exposed to these types of infections.  Avoid heavy alcohol use. If you drink, keep it to less than 2 drinks/day and not every day.  Health Care Power of Attorney.  Choose someone you trust that could speak for you if you became unable to speak for yourself.  Depression is common in our stressful world.If you're feeling down or losing interest in things you normally enjoy, please come in for a visit.

## 2021-01-14 ENCOUNTER — Encounter: Payer: Self-pay | Admitting: Family Medicine

## 2021-01-15 ENCOUNTER — Other Ambulatory Visit: Payer: Self-pay

## 2021-01-15 ENCOUNTER — Telehealth: Payer: Self-pay

## 2021-01-15 DIAGNOSIS — M2550 Pain in unspecified joint: Secondary | ICD-10-CM

## 2021-01-15 NOTE — Telephone Encounter (Signed)
Patient states he has missed a phone call.    I have advised patient that he was referred to [facility].  I have given patient the phone number to call to get scheduled.    If there is anything else patient needs to be notified, please follow up in regard.

## 2021-02-04 NOTE — Progress Notes (Signed)
Subjective:    I'm seeing this patient as a consultation for: [employer]. Note will be routed back to referring provider/PCP.  CC: Arthralgia  I, Christoper Fabian, LAT, ATC, am serving as scribe for Dr. Clementeen Graham.  HPI: Pt is a 35 y/o male c/o pain in multiple areas. Pt c/o experiencing multiple minor injuries from running and using an exercise bike; knee pain, muscle pain/strains etc. Today, pt reports R h/s and calf pain since April 2022 after doing a lot of riding on an exercise bike.  He states that this pain has improved but is still noticing the symptoms especially w/ the cycling motion.  He then started doing some running which wasn't bothering his R h/s and calf but noticed some B knee pain.  He reports a hx of B knee pain w/ running over the years and wants to make sure he's not doing anything wrong in terms of form to explain the knee pain.  He tried Voltaren gel and stretching for his R h/s and calf.  Dx imaging: 06/17/20 5th L toe XR  03/27/20 L ankle XR  07/15/18 L foot XR  Past medical history, Surgical history, Family history, Social history, Allergies, and medications have been entered into the medical record, reviewed.   Review of Systems: No new headache, visual changes, nausea, vomiting, diarrhea, constipation, dizziness, abdominal pain, skin rash, fevers, chills, night sweats, weight loss, swollen lymph nodes, body aches, joint swelling, muscle aches, chest pain, shortness of breath, mood changes, visual or auditory hallucinations.   Objective:    Vitals:   02/05/21 1024  BP: 106/82  Pulse: 80  SpO2: 97%   General: Well Developed, well nourished, and in no acute distress.  Neuro/Psych: Alert and oriented x3, extra-ocular muscles intact, able to move all 4 extremities, sensation grossly intact. Skin: Warm and dry, no rashes noted.  Respiratory: Not using accessory muscles, speaking in full sentences, trachea midline.  Cardiovascular: Pulses palpable, no  extremity edema. Abdomen: Does not appear distended. MSK: L-spine nontender midline normal lumbar motion.  Negative slump test.  Lower extremity strength is intact except noted below.  Reflexes are intact and equal bilaterally.  Right hamstring normal.  Minimally tender normal strength negative H test.  Right calf normal.  Nontender normal strength.  Bilateral knees normal-appearing with exception of minimal VMO decreased bulk.  Nontender normal motion.  Hip abduction strength slightly decreased bilaterally.    Impression and Recommendations:    Assessment and Plan: 35 y.o. male with right hamstring and calf strain after heavy duty exercise bike riding injury and February.  Fundamentally I think he suffered a bit of a muscle strain and has not done a lot of effective rehab to get a better.  I think would do great with PT.  PT referral placed today.  Recheck in 6 weeks or so if not improved.  Chronic bilateral anterior knee pain with running.  Classic for patellofemoral pain syndrome.  He is a bit of VMO atrophy relative to his otherwise excellent leg strength and has a little bit of hip abductor weakness as well.  He should do very well again with PT.  PT referral placed today.Marland Kitchen  PDMP not reviewed this encounter. Orders Placed This Encounter  Procedures   Ambulatory referral to Physical Therapy    Referral Priority:   Routine    Referral Type:   Physical Medicine    Referral Reason:   Specialty Services Required    Requested Specialty:   Physical  Therapy    Number of Visits Requested:   1   No orders of the defined types were placed in this encounter.   Discussed warning signs or symptoms. Please see discharge instructions. Patient expresses understanding.   The above documentation has been reviewed and is accurate and complete Lynne Leader, M.D.

## 2021-02-05 ENCOUNTER — Other Ambulatory Visit: Payer: Self-pay

## 2021-02-05 ENCOUNTER — Ambulatory Visit: Payer: Commercial Managed Care - PPO | Admitting: Family Medicine

## 2021-02-05 ENCOUNTER — Encounter: Payer: Self-pay | Admitting: Family Medicine

## 2021-02-05 ENCOUNTER — Ambulatory Visit: Payer: Self-pay

## 2021-02-05 VITALS — BP 106/82 | HR 80 | Ht 70.0 in | Wt 161.4 lb

## 2021-02-05 DIAGNOSIS — M222X1 Patellofemoral disorders, right knee: Secondary | ICD-10-CM

## 2021-02-05 DIAGNOSIS — S86811A Strain of other muscle(s) and tendon(s) at lower leg level, right leg, initial encounter: Secondary | ICD-10-CM | POA: Diagnosis not present

## 2021-02-05 DIAGNOSIS — M79604 Pain in right leg: Secondary | ICD-10-CM

## 2021-02-05 DIAGNOSIS — M222X2 Patellofemoral disorders, left knee: Secondary | ICD-10-CM

## 2021-02-05 DIAGNOSIS — S76311A Strain of muscle, fascia and tendon of the posterior muscle group at thigh level, right thigh, initial encounter: Secondary | ICD-10-CM | POA: Diagnosis not present

## 2021-02-05 NOTE — Patient Instructions (Signed)
Thank you for coming in today.   I've referred you to Physical Therapy.  Let us know if you don't hear from them in one week.   Please use Voltaren gel (Generic Diclofenac Gel) up to 4x daily for pain as needed.  This is available over-the-counter as both the name brand Voltaren gel and the generic diclofenac gel.   Recheck in about 6 weeks especially if not improved.   Let me know if this is not working or if you have a problem.

## 2021-02-15 ENCOUNTER — Other Ambulatory Visit: Payer: Self-pay

## 2021-02-15 ENCOUNTER — Ambulatory Visit: Payer: Commercial Managed Care - PPO | Admitting: Physical Therapy

## 2021-02-15 DIAGNOSIS — M25561 Pain in right knee: Secondary | ICD-10-CM

## 2021-02-15 DIAGNOSIS — M25562 Pain in left knee: Secondary | ICD-10-CM

## 2021-02-15 DIAGNOSIS — G8929 Other chronic pain: Secondary | ICD-10-CM | POA: Diagnosis not present

## 2021-02-15 DIAGNOSIS — M79604 Pain in right leg: Secondary | ICD-10-CM

## 2021-02-16 ENCOUNTER — Encounter: Payer: Self-pay | Admitting: Physical Therapy

## 2021-02-16 NOTE — Patient Instructions (Signed)
Access Code: [email or web address] URL: [email or web address] Date: 02/16/2021 Prepared by: Sedalia Muta  Exercises Gastroc Stretch on Wall - 2 x daily - 3 reps - 30 hold Standing Ankle Dorsiflexion Stretch - 2 x daily - 3 reps - 30 hold Seated Hamstring Stretch - 2 x daily - 3 reps - 30 hold

## 2021-02-16 NOTE — Therapy (Signed)
[facility] [address] [place], [place], [address] Phone: [phone]   Fax:  [phone]  Physical Therapy Evaluation  Patient Details  Name: Eric Espinoza MRN: [id number] Date of Birth: 1986/07/10 Referring Provider (PT): Clementeen Graham   Encounter Date: 02/15/2021   PT End of Session - 02/16/21 1354     Visit Number 1    Number of Visits 12    Date for PT Re-Evaluation 03/29/21    Authorization Type UHC    PT Start Time 1215    PT Stop Time 1255    PT Time Calculation (min) 40 min    Activity Tolerance Patient tolerated treatment well    Behavior During Therapy [facility] for tasks assessed/performed             Past Medical History:  Diagnosis Date   GERD (gastroesophageal reflux disease)     Past Surgical History:  Procedure Laterality Date   APPENDECTOMY  1999    There were no vitals filed for this visit.    Subjective Assessment - 02/16/21 1344     Subjective Pt notes increased pain in R leg since April, after using Stationary bike(upright bike) . Had pain in back of knee, in calf and hamstring, has improved some, but  still unable to cycle or squat., Is able to run without pain in posterior LE. BUt notes some increased anterior knee pain with consistent running. Is able to run about 2 mi at this time. Pt would like to be able to be more consistent with exercise, feels pain is limiting at times. 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    Limitations Lifting;House hold activities    Patient Stated Goals decreased pain in leg and knees, return to activity without pain    Currently in Pain? Yes    Pain Score 3     Pain Location Knee    Pain Orientation Right;Left    Pain Descriptors / Indicators Aching    Pain Type Chronic pain    Pain Onset More than a month ago    Pain Frequency Intermittent    Aggravating Factors  running    Pain Relieving Factors rest    Multiple Pain Sites Yes     Pain Score 3    Pain Location Leg    Pain Orientation Right    Pain Descriptors / Indicators Tightness;Sore    Pain Type Acute pain    Pain Onset More than a month ago    Pain Frequency Intermittent    Aggravating Factors  cycling, deeper bend, squat                [facility] PT Assessment - 02/16/21 0001       Assessment   Medical Diagnosis R Leg pain    Referring Provider (PT) Clementeen Graham    Prior Therapy no      Precautions   Precautions None      Balance Screen   Has the patient fallen in the past 6 months No      Prior Function   Level of Independence Independent      Cognition   Overall Cognitive Status Within Functional Limits for tasks assessed      Posture/Postural Control   Posture Comments Standing: flat foot posture bilaterally,      ROM / Strength   AROM / PROM / Strength AROM;Strength      AROM   Overall AROM Comments lumbar: WFL,  hips: WFL, knees: WFL      Strength   Overall Strength Comments Hips: 4/5 to 4+/5, Knees: 4+/5      Palpation   Palpation comment No pain to palpate anterior/medial knees today, (but states pain here with activity),  Does have tenderness in mid- proximal gastroc (with trigger points) , and mid/distal hamstring. No pain with deep palpation of posterior knee.      Special Tests   Other special tests mild pain with HS stretch, mild reproduction of pain with gastroc stretch.      Ambulation/Gait   Gait Comments SLS  To be tested                        Objective measurements completed on examination: See above findings.       [facility] Adult PT Treatment/Exercise - 02/16/21 0001       Exercises   Exercises Knee/Hip      Knee/Hip Exercises: Stretches   Active Hamstring Stretch 3 reps;30 seconds    Active Hamstring Stretch Limitations seated    Gastroc Stretch 3 reps;30 seconds;Both    Gastroc Stretch Limitations at wall    Soleus Stretch 2 reps;30 seconds    Soleus Stretch Limitations at wall                     PT Education - 02/16/21 1354     Education Details PT POC, Exam findings, HEP    Person(s) Educated Patient    Methods Explanation;Demonstration;Tactile cues;Verbal cues;Handout    Comprehension Verbalized understanding;Returned demonstration;Verbal cues required;Tactile cues required;Need further instruction              PT Short Term Goals - 02/16/21 1405       PT SHORT TERM GOAL #1   Title Pt to be independent with initial HEP    Time 2    Period Weeks    Status New    Target Date 03/01/21               PT Long Term Goals - 02/16/21 1405       PT LONG TERM GOAL #1   Title Pt to be independent with final HEP    Time 6    Period Weeks    Status New    Target Date 03/29/21      PT LONG TERM GOAL #2   Title Pt to demo ability for squat (with optimal mechanics), bend, and cycling motions to be WNL and pain free, to improve ability for community and functional activities.    Time 6    Period Weeks    Status New    Target Date 03/29/21      PT LONG TERM GOAL #3   Title Pt to report ability for running, up to 3 miles, without anterior knee pain    Time 6    Period Weeks    Status New    Target Date 03/29/21                    Plan - 02/16/21 1409     Clinical Impression Statement Pt presents with primary complaint of increased pain in R leg, posteriorly in hamstring and gastroc. Pt with ongoing diffiuclty with knee flexion, cycling, and squats due to pain. Pt with decreased ability for exercise and sustaining exercise routine due to pain and deficits. He also has chronic reocurring pain in bil anterior knees with running. He  has lack of effective HEP for his diagnosis, and will benefit from strengthening and mobility program. Pt also with flat foot posture bilaterally, will bring in running shoes to assess next visit. Pt with compensatory supination, seen in current shoes, very worn on lateral heel. Pt with decreased ability for  full functional activities due to pain, and will benefit from skilled PT to improve .    Examination-Activity Limitations Squat;Bend;Stairs;Lift    Examination-Participation Restrictions Cleaning;Community Activity;Yard Work    Stability/Clinical Decision Making Stable/Uncomplicated    Clinical Decision Making Low    Rehab Potential Good    PT Frequency 2x / week    PT Duration 6 weeks    PT Treatment/Interventions ADLs/Self Care Home Management;Cryotherapy;Electrical Stimulation;Iontophoresis 4mg /ml Dexamethasone;Moist Heat;Balance training;Therapeutic exercise;Therapeutic activities;Functional mobility training;Stair training;Gait training;DME Instruction;Ultrasound;Neuromuscular re-education;Patient/family education;Orthotic Fit/Training;Manual techniques;Passive range of motion;Dry needling;Taping;Vasopneumatic Device;Spinal Manipulations;Joint Manipulations    PT Home Exercise Plan [email or web address]    Consulted and Agree with Plan of Care Patient             Patient will benefit from skilled therapeutic intervention in order to improve the following deficits and impairments:  Decreased activity tolerance, Pain, Decreased strength, Decreased mobility, Increased muscle spasms, Decreased range of motion, Improper body mechanics, Impaired flexibility  Visit Diagnosis: Pain in right leg  Chronic pain of left knee  Chronic pain of right knee     Problem List Patient Active Problem List   Diagnosis Date Noted   Family history of prostate cancer 07/22/2019   07/24/2019, PT, DPT 2:15 PM  02/16/21    [facility] [address] [place], [place], [place] Phone: [phone]   Fax:  [phone]  Name: Eric Espinoza MRN: Delma Officer Date of Birth: 12/20/1985

## 2021-02-24 ENCOUNTER — Other Ambulatory Visit: Payer: Self-pay

## 2021-02-24 ENCOUNTER — Ambulatory Visit (INDEPENDENT_AMBULATORY_CARE_PROVIDER_SITE_OTHER): Payer: Commercial Managed Care - PPO | Admitting: Physical Therapy

## 2021-02-24 ENCOUNTER — Encounter: Payer: Self-pay | Admitting: Physical Therapy

## 2021-02-24 DIAGNOSIS — M25562 Pain in left knee: Secondary | ICD-10-CM

## 2021-02-24 DIAGNOSIS — M79604 Pain in right leg: Secondary | ICD-10-CM

## 2021-02-24 DIAGNOSIS — M25561 Pain in right knee: Secondary | ICD-10-CM | POA: Diagnosis not present

## 2021-02-24 DIAGNOSIS — G8929 Other chronic pain: Secondary | ICD-10-CM

## 2021-02-24 NOTE — Patient Instructions (Signed)
Access Code: [email or web address] URL: [email or web address] Date: 02/24/2021 Prepared by: Sedalia Muta  Exercises Standing Bilateral Heel Raise on Step - 1 x daily - 2 sets - 10 reps Gastroc Stretch on Wall - 2 x daily - 3 reps - 30 hold Standing Ankle Dorsiflexion Stretch - 2 x daily - 3 reps - 30 hold Seated Hamstring Stretch - 2 x daily - 3 reps - 30 hold Mini Squat - 1 x daily - 1-2 sets - 10 reps Single Leg Stance - 2 x daily - 3 reps - 30 hold The Diver - 1 x daily - 1-2 sets - 10 reps

## 2021-02-24 NOTE — Therapy (Addendum)
[facility] [address] [place], [place], [address] Phone: [phone]   Fax:  [phone]  Physical Therapy Treatment  Patient Details  Name: Eric Espinoza MRN: [id number] Date of Birth: 12-Mar-1986 Referring Provider (PT): Lynne Leader   Encounter Date: 02/24/2021   PT End of Session - 02/24/21 0910     Visit Number 2    Number of Visits 12    Date for PT Re-Evaluation 03/29/21    Authorization Type UHC    PT Start Time 0803    PT Stop Time 0858    PT Time Calculation (min) 55 min    Activity Tolerance Patient tolerated treatment well    Behavior During Therapy [facility] for tasks assessed/performed             Past Medical History:  Diagnosis Date   GERD (gastroesophageal reflux disease)     Past Surgical History:  Procedure Laterality Date   APPENDECTOMY  1999    There were no vitals filed for this visit.   Subjective Assessment - 02/24/21 0822     Subjective Pt has been doing HEP. He has noticed continued pain in R calf, less pain in hamstring.    Currently in Pain? Yes    Pain Score 0-No pain    Pain Location Knee    Pain Orientation Right;Left    Pain Score 3    Pain Location Calf    Pain Orientation Right    Pain Descriptors / Indicators Sore    Pain Type Acute pain    Pain Onset More than a month ago    Pain Frequency Intermittent    Aggravating Factors  cycling, deeper bendd, squat                               [facility] Adult PT Treatment/Exercise - 02/24/21 0001       Ambulation/Gait   Gait Comments SLS: increased sway and decreased time on R vs L.      Posture/Postural Control   Posture Comments Standing: flat foot posture bilaterally,      Exercises   Exercises Knee/Hip      Knee/Hip Exercises: Stretches   Active Hamstring Stretch 3 reps;30 seconds    Active Hamstring Stretch Limitations seated    Soleus Stretch 2 reps;30 seconds    Soleus Stretch Limitations at wall       Knee/Hip Exercises: Aerobic   Recumbent Bike L2 x 8 min ;      Knee/Hip Exercises: Standing   Heel Raises 20 reps    Heel Raises Limitations Eccentric at step    Functional Squat 20 reps    Functional Squat Limitations x20 reg;  x15 with 25 lb, education on form    SLS 30 sec x 2 bil;    SLS with Vectors SLS with fwd reach and hip hinge 2x 5 bil;      Knee/Hip Exercises: Seated   Hamstring Curl 20 reps;Both    Hamstring Limitations Blue TB    Sit to [employer] 10 reps                      PT Short Term Goals - 02/16/21 1405       PT SHORT TERM GOAL #1   Title Pt to be independent with initial HEP    Time 2    Period Weeks    Status New  Target Date 03/01/21               PT Long Term Goals - 02/16/21 1405       PT LONG TERM GOAL #1   Title Pt to be independent with final HEP    Time 6    Period Weeks    Status New    Target Date 03/29/21      PT LONG TERM GOAL #2   Title Pt to demo ability for squat (with optimal mechanics), bend, and cycling motions to be WNL and pain free, to improve ability for community and functional activities.    Time 6    Period Weeks    Status New    Target Date 03/29/21      PT LONG TERM GOAL #3   Title Pt to report ability for running, up to 3 miles, without anterior knee pain    Time 6    Period Weeks    Status New    Target Date 03/29/21                   Plan - 02/24/21 0911     Clinical Impression Statement Pt reports most pain being in calf vs hamstring this week. Today pt was able to perform exercise bike as well as HS and calf activation without increased pain. Education and practice for optimal squat mechanics, pt with much improved ablity and mechanics after practice. Noted decreased stability in R vs L with SLS and SL reach activity. HEP updated to include todays activities. Pt wearing NB trail shoe with orthotic. Discussed optimal footwear for foot and knee positioning, given footwear recommnedation  for stability shoe. Plan to progress Dead lift motion and weight next visit, as well as address trigger points in R gastroc.    Examination-Activity Limitations Squat;Bend;Stairs;Lift    Examination-Participation Restrictions Cleaning;Community Activity;Yard Work    Stability/Clinical Decision Making Stable/Uncomplicated    Rehab Potential Good    PT Frequency 2x / week    PT Duration 6 weeks    PT Treatment/Interventions ADLs/Self Care Home Management;Cryotherapy;Electrical Stimulation;Iontophoresis 33m/ml Dexamethasone;Moist Heat;Balance training;Therapeutic exercise;Therapeutic activities;Functional mobility training;Stair training;Gait training;DME Instruction;Ultrasound;Neuromuscular re-education;Patient/family education;Orthotic Fit/Training;Manual techniques;Passive range of motion;Dry needling;Taping;Vasopneumatic Device;Spinal Manipulations;Joint Manipulations    PT Home Exercise Plan [email or web address]    Consulted and Agree with Plan of Care Patient             Patient will benefit from skilled therapeutic intervention in order to improve the following deficits and impairments:  Decreased activity tolerance, Pain, Decreased strength, Decreased mobility, Increased muscle spasms, Decreased range of motion, Improper body mechanics, Impaired flexibility  Visit Diagnosis: Pain in right leg  Chronic pain of left knee  Chronic pain of right knee     Problem List Patient Active Problem List   Diagnosis Date Noted   Family history of prostate cancer 07/22/2019   LLyndee Hensen PT, DPT 9:19 AM  02/24/21    [facility] [address]Phone: [phone]  Fax:  [phone] Name: Eric HopkinMRN: [id number] of Birth: [birth year] PHYSICAL THERAPY DISCHARGE SUMMARY  Visits from Start of Care: 2 Plan: Patient agrees to discharge.  Patient goals were partially met. Patient is being discharged due to -  not returning since last visit       LLyndee Hensen PT, DPT 11:04 AM  08/03/21

## 2021-03-03 ENCOUNTER — Encounter: Payer: Commercial Managed Care - PPO | Admitting: Physical Therapy

## 2021-03-15 ENCOUNTER — Ambulatory Visit: Payer: Commercial Managed Care - PPO | Admitting: Family Medicine

## 2021-03-17 IMAGING — CR DG TOE 5TH 2+V*L*
6 series · 6 of 6 positions shown · non-contrast
Comparison: None.

CLINICAL DATA: 34-year-old male status post left 5th toe injury.

EXAM:
DG TOE 5TH LEFT

[toe ap (1 of 2)]
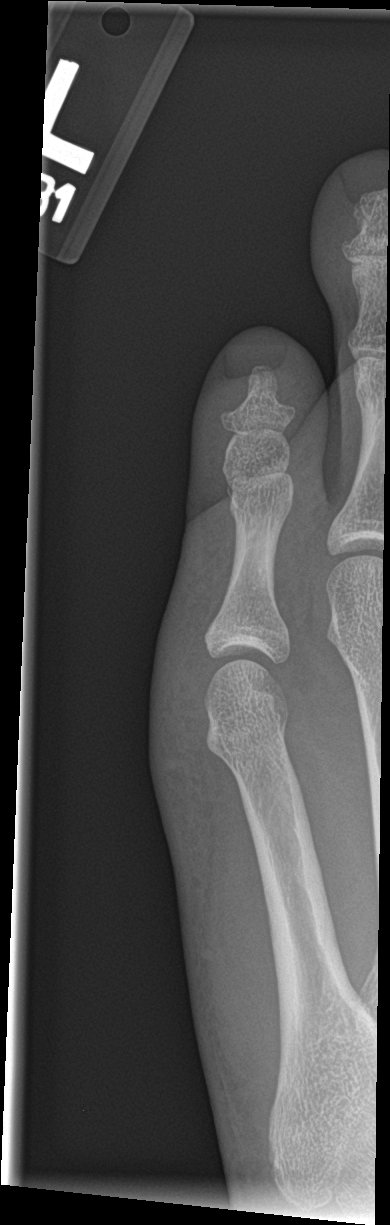

[toe ap (2 of 2)]
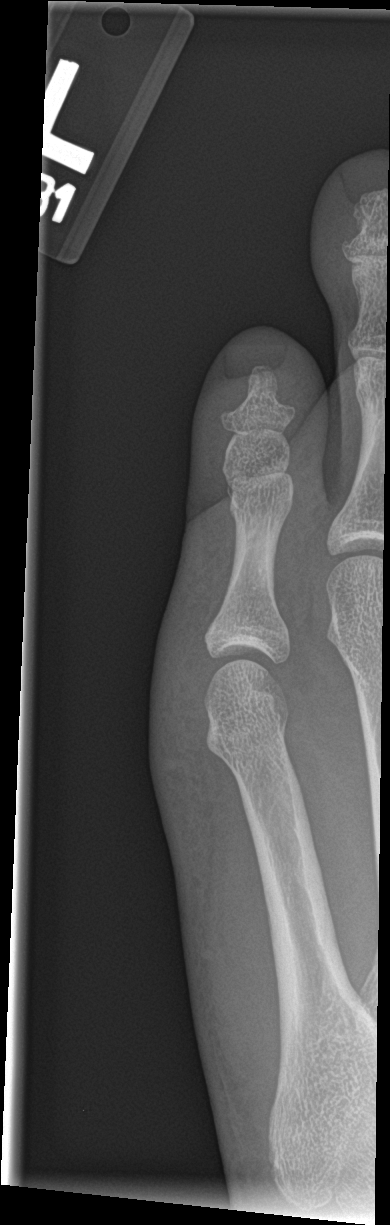

[toe obl (1 of 2)]
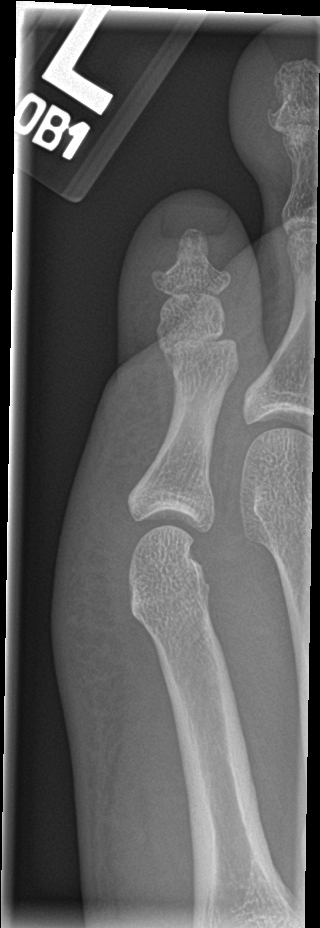

[toe obl (2 of 2)]
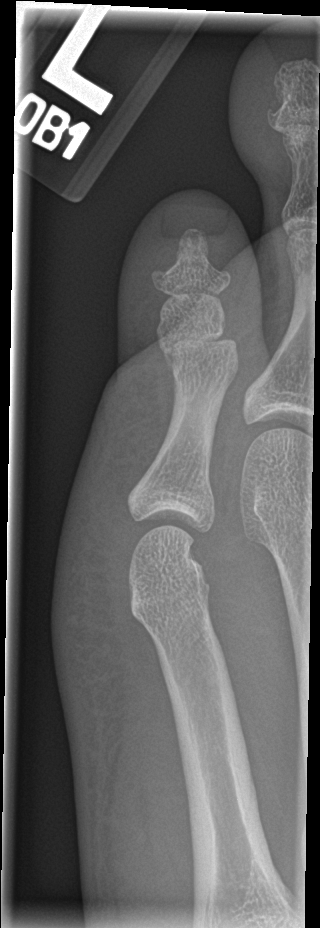

[toe lat (1 of 2)]
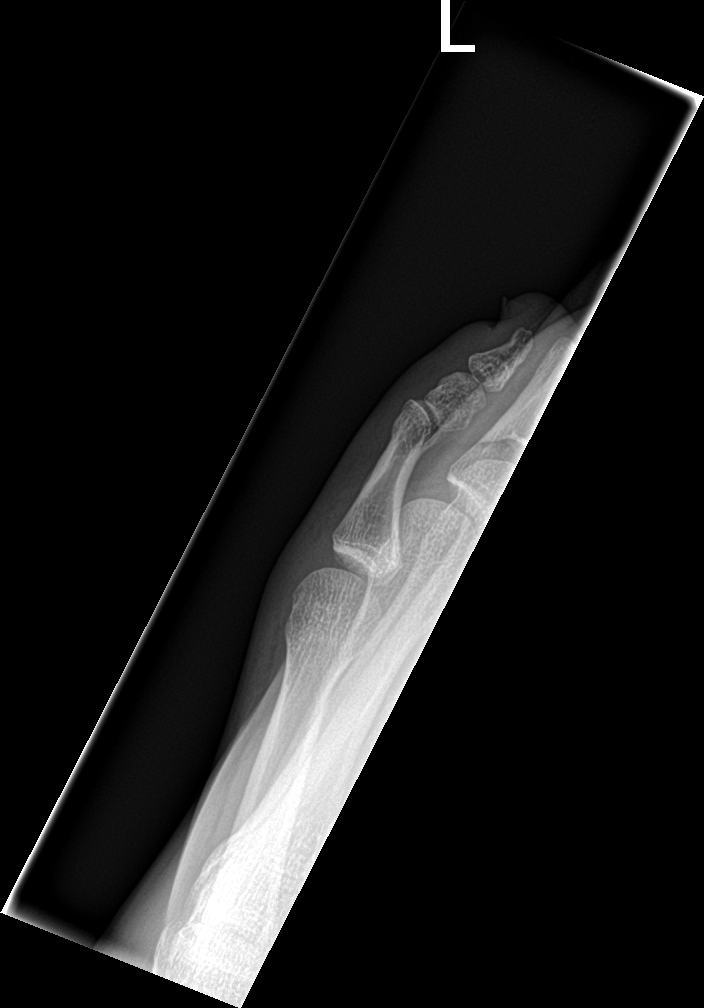

[toe lat (2 of 2)]
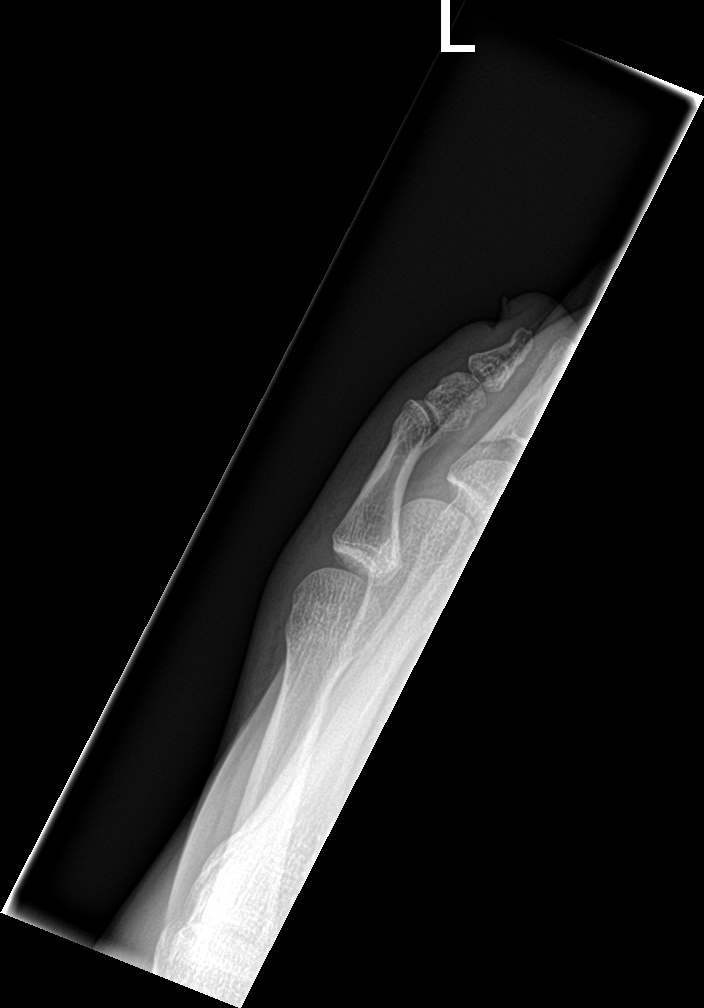

[6 of 6 positions shown; findings below may reference images not displayed]

FINDINGS: Generalized soft tissue swelling. Underlying normal bone
mineralization. Alignment is preserved in the left 5th toe. The 5th
MTP joint is normal. Possible tiny ossific fragment along the
lateral aspect of the 5th PIP joint which might reflect a tiny
periarticular fracture. But otherwise the 5th phalanges are intact.
IMPRESSION: Questionable tiny fracture fragment along the lateral 5th PIP, but
otherwise intact left 5th toe phalanges.

## 2022-01-19 ENCOUNTER — Encounter: Payer: Self-pay | Admitting: Family Medicine

## 2022-03-28 ENCOUNTER — Encounter: Payer: Commercial Managed Care - PPO | Admitting: Family Medicine

## 2022-03-28 ENCOUNTER — Ambulatory Visit (INDEPENDENT_AMBULATORY_CARE_PROVIDER_SITE_OTHER): Payer: Commercial Managed Care - PPO | Admitting: Family Medicine

## 2022-03-28 ENCOUNTER — Encounter: Payer: Self-pay | Admitting: Family Medicine

## 2022-03-28 VITALS — BP 100/60 | HR 68 | Temp 98.2°F | Ht 70.0 in | Wt 166.4 lb

## 2022-03-28 DIAGNOSIS — L309 Dermatitis, unspecified: Secondary | ICD-10-CM

## 2022-03-28 DIAGNOSIS — Z0000 Encounter for general adult medical examination without abnormal findings: Secondary | ICD-10-CM | POA: Diagnosis not present

## 2022-03-28 MED ORDER — TRIAMCINOLONE ACETONIDE 0.1 % EX CREA: 1.0000 | TOPICAL_CREAM | Freq: Two times a day (BID) | CUTANEOUS | 0 refills | Status: DC

## 2022-03-28 NOTE — Patient Instructions (Addendum)
Please return in 12 months for your annual complete physical; please come fasting. Schedule a nurse visit to get your Tdap booster.  I will release your lab results to you on your MyChart account with further instructions. You may see the results before I do, but when I review them I will send you a message with my report or have my assistant call you if things need to be discussed. Please reply to my message with any questions. Thank you!   Try the steroid cream and let me know if it doesn't help.   If you have any questions or concerns, please don't hesitate to send me a message via MyChart or call the office at [phone]. Thank you for visiting with Eric Espinoza today! It's our pleasure caring for you.   Eczema Eczema refers to a group of skin conditions that cause skin to become rough and inflamed. Each type of eczema has different triggers, symptoms, and treatments. Eczema of any type is usually itchy. Symptoms range from mild to severe. Eczema is not spread from person to person (is not contagious). It can appear on different parts of the body at different times. One person's eczema may look different from another person's eczema. What are the causes? The exact cause of this condition is not known. However, exposure to certain environmental factors, irritants, and allergens can make the condition worse. What are the signs or symptoms? Symptoms of this condition depend on the type of eczema you have. The types include: Contact dermatitis. There are two kinds: Irritant contact dermatitis. This happens when something irritates the skin and causes a rash. Allergic contact dermatitis. This happens when your skin comes in contact with something you are allergic to (allergens). This can include poison ivy, chemicals, or medicines that were applied to your skin. Atopic dermatitis. This is a long-term (chronic) skin disease that keeps coming back (recurring). It is the most common type of eczema. Usual  symptoms are a red rash and itchy, dry, scaly skin. It usually starts showing signs in infancy and can last through adulthood. Dyshidrotic eczema. This is a form of eczema on the hands and feet. It shows up as very itchy, fluid-filled blisters. It can affect people of any age but is more common before age 38. Hand eczema. This causes very itchy areas of skin on the palms and sides of the hands and fingers. This type of eczema is common in industrial jobs where you may be exposed to different types of irritants. Lichen simplex chronicus. This type of eczema occurs when a person constantly scratches one area of the body. Repeated scratching of the area leads to thickened skin (lichenification). This condition can accompany other types of eczema. It is more common in adults but may also be seen in children. Nummular eczema. This is a common type of eczema that most often affects the lower legs and the backs of the hands. It typically causes an itchy, red, circular, crusty lesion (plaque). Scratching may become a habit and can cause bleeding. Nummular eczema occurs most often in middle-aged or older people. Seborrheic dermatitis. This is a common skin disease that mainly affects the scalp. It may also affect other oily areas of the body, such as the face, sides of the nose, eyebrows, ears, eyelids, and chest. It is marked by small scaling and redness of the skin (erythema). This can affect people of all ages. In infants, this condition is called cradle cap. Stasis dermatitis. This is a common skin disease that  can cause itching, scaling, and hyperpigmentation, usually on the legs and feet. It occurs most often in people who have a condition that prevents blood from being pumped through the veins in the legs (chronic venous insufficiency). Stasis dermatitis is a chronic condition that needs long-term management. How is this diagnosed? This condition may be diagnosed based on: A physical exam of your skin. Your  medical history. Skin patch tests. These tests involve using patches that contain possible allergens and placing them on your back. Your health care provider will check in a few days to see if an allergic reaction occurred. How is this treated? Treatment for eczema is based on the type of eczema you have. You may be given hydrocortisone steroid medicine or antihistamines. These can relieve itching quickly and help reduce inflammation. These may be prescribed or purchased over the counter, depending on the strength that is needed. Follow these instructions at home: Take or apply over-the-counter and prescription medicines only as told by your health care provider. Use creams or ointments to moisturize your skin. Do not use lotions. Learn what triggers or irritates your symptoms so you can avoid these things. Treat symptom flare-ups quickly. Do not scratch your skin. This can make your rash worse. Keep all follow-up visits. This is important. Where to find more information American Academy of Dermatology: MarketingSheets.si National Eczema Association: nationaleczema.org The Society for Pediatric Dermatology: pedsderm.net Contact a health care provider if: You have severe itching, even with treatment. You scratch your skin regularly until it bleeds. Your rash looks different than usual. Your skin is painful, swollen, or more red than usual. You have a fever. Summary Eczema refers to a group of skin conditions that cause skin to become rough and inflamed. Each type has different triggers. Eczema of any type causes itching that may range from mild to severe. Treatment varies based on the type of eczema you have. Hydrocortisone steroid medicine or antihistamines can help with itching and inflammation. Protecting your skin is the best way to prevent eczema. Use creams or ointments to moisturize your skin. Avoid triggers and irritants. Treat flare-ups quickly. This information is not intended to replace  advice given to you by your health care provider. Make sure you discuss any questions you have with your health care provider. Document Revised: 04/27/2020 Document Reviewed: 04/27/2020 Elsevier Patient Education  2023 [employer].

## 2022-03-28 NOTE — Progress Notes (Signed)
Subjective  Chief Complaint  Patient presents with   Annual Exam    Pt here for Annual exam and is not currently fasting   HPI: Eric Espinoza is a 36 y.o. male who presents to [facility] at [place] today for a Male Wellness Visit.   Wellness Visit: annual visit with health maintenance review and exam   Healthy 36 yo male. Only concern is red patch on left cheek since January. Dry and flaky. No pain, burning or drainage. At times will have small red bumps. Got better when he traveled for vacation a few times. Healthy lifestyle. Eligible for tdap.   Body mass index is 23.88 kg/m. Wt Readings from Last 3 Encounters:  03/28/22 166 lb 6.4 oz (75.5 kg)  02/05/21 161 lb 6.4 oz (73.2 kg)  09/01/20 166 lb (75.3 kg)    Patient Active Problem List   Diagnosis Date Noted   Family history of prostate cancer 07/22/2019   Health Maintenance  Topic Date Due   TETANUS/TDAP  Never done   INFLUENZA VACCINE  03/01/2022   COVID-19 Vaccine (4 - Pfizer series) 04/13/2022 (Originally 09/09/2020)   Hepatitis C Screening  Completed   HIV Screening  Completed   HPV VACCINES  Aged Out   Immunization History  Administered Date(s) Administered   Influenza Inj Mdck Quad With Preservative 05/27/2019   Influenza-Unspecified 06/01/2020   PFIZER(Purple Top)SARS-COV-2 Vaccination 10/30/2019, 11/20/2019, 07/15/2020   We updated and reviewed the patient's past history in detail and it is documented below. Allergies: Patient has No Known Allergies. Past Medical History  has a past medical history of GERD (gastroesophageal reflux disease). Past Surgical History  has a past surgical history that includes Appendectomy (1999). Social History Patient  reports that he has never smoked. He has never used smokeless tobacco. He reports that he does not currently use alcohol. He reports that he does not use drugs. Family History Patient family history includes Depression in his sister; Diabetes in  his paternal grandfather; Healthy in his daughter; Hearing loss in his paternal grandfather; Heart disease in his paternal grandfather; High blood pressure in his paternal grandfather; Liver cancer in his mother; Mental illness in his paternal grandfather; Prostate cancer in his father, paternal grandfather, and paternal uncle. Review of Systems: Constitutional: negative for fever or malaise Ophthalmic: negative for photophobia, double vision or loss of vision Cardiovascular: negative for chest pain, dyspnea on exertion, or new LE swelling Respiratory: negative for SOB or persistent cough Gastrointestinal: negative for abdominal pain, change in bowel habits or melena Genitourinary: negative for dysuria or gross hematuria Musculoskeletal: negative for new gait disturbance or muscular weakness Integumentary: negative for new or persistent rashes, no breast lumps Neurological: negative for TIA or stroke symptoms Psychiatric: negative for SI or delusions Allergic/Immunologic: negative for hives  Patient Care Team    Relationship Specialty Notifications Start End  Willow Ora, MD PCP - General Family Medicine  07/19/19    Objective  Vitals: BP 100/60   Pulse 68   Temp 98.2 F (36.8 C)   Ht 5\' 10"  (1.778 m)   Wt 166 lb 6.4 oz (75.5 kg)   SpO2 97%   BMI 23.88 kg/m  General:  Well developed, well nourished, no acute distress  Psych:  Alert and orientedx3,normal mood and affect HEENT:  Normocephalic, atraumatic, non-icteric sclera, PERRL, oropharynx is clear without mass or exudate, supple neck without adenopathy, mass or thyromegaly Cardiovascular:  Normal S1, S2, RRR without gallop, rub or murmur, nondisplaced  PMI, +2 distal pulses in bilateral upper and lower extremities. Respiratory:  Good breath sounds bilaterally, CTAB with normal respiratory effort Gastrointestinal: normal bowel sounds, soft, non-tender, no noted masses. No HSM MSK: no deformities, contusions. Joints are without  erythema or swelling. Spine and CVA region are nontender Skin:  Warm, no suspicious lesions noted,left cheek with quarter sized macular rash with flaking.  Neurologic:    Mental status is normal. CN 2-11 are normal. Gross motor and sensory exams are normal. Stable gait. No tremor GU: No inguinal hernias or adenopathy are appreciated bilaterally  Assessment  1. Annual physical exam   2. Eczema of face      Plan  Male Wellness Visit: Age appropriate Health Maintenance and Prevention measures were discussed with patient. Included topics are cancer screening recommendations, ways to keep healthy (see AVS) including dietary and exercise recommendations, regular eye and dental care, use of seat belts, and avoidance of moderate alcohol use and tobacco use.  BMI: discussed patient's BMI and encouraged positive lifestyle modifications to help get to or maintain a target BMI. HM needs and immunizations were addressed and ordered. See below for orders. See HM and immunization section for updates. Routine labs and screening tests ordered including cmp, cbc and lipids where appropriate. Discussed recommendations regarding Vit D and calcium supplementation (see AVS) Trial of triamcinolone cream bid x 1-2 weeks.   Follow up: 12 mo cpe  Commons side effects, risks, benefits, and alternatives for medications and treatment plan prescribed today were discussed, and the patient expressed understanding of the given instructions. Patient is instructed to call or message via MyChart if he/she has any questions or concerns regarding our treatment plan. No barriers to understanding were identified. We discussed Red Flag symptoms and signs in detail. Patient expressed understanding regarding what to do in case of urgent or emergency type symptoms.  Medication list was reconciled, printed and provided to the patient in AVS. Patient instructions and summary information was reviewed with the patient as documented in the  AVS. This note was prepared with assistance of Dragon voice recognition software. Occasional wrong-word or sound-a-like substitutions may have occurred due to the inherent limitations of voice recognition software This visit occurred during the SARS-CoV-2 public health emergency.  Safety protocols were in place, including screening questions prior to the visit, additional usage of staff PPE, and extensive cleaning of exam room while observing appropriate contact time as indicated for disinfecting solutions.   Orders Placed This Encounter  Procedures   CBC with Differential/Platelet   Comprehensive metabolic panel   Lipid panel   Meds ordered this encounter  Medications   triamcinolone cream (KENALOG) 0.1 %    Sig: Apply 1 Application topically 2 (two) times daily. For 2 weeks, then as needed    Dispense:  45 g    Refill:  0

## 2022-03-29 LAB — LIPID PANEL
Cholesterol: 149 mg/dL (ref 0–200)
HDL: 41.2 mg/dL (ref >39.00)
LDL Cholesterol: 83 mg/dL (ref 0–99)
NonHDL: 107.91
Total CHOL/HDL Ratio: 4
Triglycerides: 126 mg/dL (ref 0.0–149.0)
VLDL: 25.2 mg/dL (ref 0.0–40.0)

## 2022-03-29 LAB — CBC WITH DIFFERENTIAL/PLATELET
Basophils Absolute: 0.1 10*3/uL (ref 0.0–0.1)
Basophils Relative: 1 % (ref 0.0–3.0)
Eosinophils Absolute: 0.2 10*3/uL (ref 0.0–0.7)
Eosinophils Relative: 3.5 % (ref 0.0–5.0)
HCT: 40.1 % (ref 39.0–52.0)
Hemoglobin: 13.9 g/dL (ref 13.0–17.0)
Lymphocytes Relative: 39.3 % (ref 12.0–46.0)
Lymphs Abs: 2 10*3/uL (ref 0.7–4.0)
MCHC: 34.8 g/dL (ref 30.0–36.0)
MCV: 87.2 fl (ref 78.0–100.0)
Monocytes Absolute: 0.3 10*3/uL (ref 0.1–1.0)
Monocytes Relative: 6.6 % (ref 3.0–12.0)
Neutro Abs: 2.5 10*3/uL (ref 1.4–7.7)
Neutrophils Relative %: 49.6 % (ref 43.0–77.0)
Platelets: 177 10*3/uL (ref 150.0–400.0)
RBC: 4.6 Mil/uL (ref 4.22–5.81)
RDW: 12.7 % (ref 11.5–15.5)
WBC: 5.1 10*3/uL (ref 4.0–10.5)

## 2022-03-29 LAB — COMPREHENSIVE METABOLIC PANEL
ALT: 12 U/L (ref 0–53)
AST: 21 U/L (ref 0–37)
Albumin: 4.5 g/dL (ref 3.5–5.2)
Alkaline Phosphatase: 73 U/L (ref 39–117)
BUN: 14 mg/dL (ref 6–23)
CO2: 26 mEq/L (ref 19–32)
Calcium: 9.5 mg/dL (ref 8.4–10.5)
Chloride: 104 mEq/L (ref 96–112)
Creatinine, Ser: 0.84 mg/dL (ref 0.40–1.50)
GFR: 112.52 mL/min (ref >60.00)
Glucose, Bld: 93 mg/dL (ref 70–99)
Potassium: 4 mEq/L (ref 3.5–5.1)
Sodium: 140 mEq/L (ref 135–145)
Total Bilirubin: 0.4 mg/dL (ref 0.2–1.2)
Total Protein: 7.4 g/dL (ref 6.0–8.3)

## 2022-04-07 ENCOUNTER — Ambulatory Visit: Payer: Commercial Managed Care - PPO

## 2022-04-14 ENCOUNTER — Ambulatory Visit: Payer: Commercial Managed Care - PPO

## 2022-04-25 ENCOUNTER — Encounter: Payer: Self-pay | Admitting: *Deleted

## 2022-11-03 ENCOUNTER — Encounter: Payer: Self-pay | Admitting: Family Medicine

## 2022-11-11 ENCOUNTER — Other Ambulatory Visit: Payer: Self-pay | Admitting: Family Medicine

## 2023-01-24 ENCOUNTER — Ambulatory Visit (INDEPENDENT_AMBULATORY_CARE_PROVIDER_SITE_OTHER): Payer: Commercial Managed Care - PPO

## 2023-01-24 DIAGNOSIS — Z23 Encounter for immunization: Secondary | ICD-10-CM

## 2023-01-27 ENCOUNTER — Encounter: Payer: Self-pay | Admitting: Family Medicine

## 2023-02-21 LAB — LIPID PANEL
Cholesterol: 151 (ref 0–200)
HDL: 47 (ref 35–70)
LDL Cholesterol: 88
LDl/HDL Ratio: 1.9
Triglycerides: 79 (ref 40–160)

## 2023-02-21 LAB — BASIC METABOLIC PANEL
BUN: 14 (ref 4–21)
Creatinine: 0.9 (ref 0.6–1.3)
Glucose: 64

## 2023-02-21 LAB — HEPATIC FUNCTION PANEL
ALT: 9 U/L — AB (ref 10–40)
AST: 22 (ref 14–40)
Alkaline Phosphatase: 81 (ref 25–125)
Bilirubin, Total: 0.2

## 2023-02-21 LAB — PROTEIN / CREATININE RATIO, URINE: Creatinine, Urine: 58

## 2023-02-21 LAB — COMPREHENSIVE METABOLIC PANEL
Albumin: 4.5 (ref 3.5–5.0)
Globulin: 2.9

## 2023-03-01 ENCOUNTER — Encounter (INDEPENDENT_AMBULATORY_CARE_PROVIDER_SITE_OTHER): Payer: Self-pay

## 2023-03-30 ENCOUNTER — Encounter: Payer: Commercial Managed Care - PPO | Admitting: Family Medicine

## 2023-04-25 ENCOUNTER — Encounter: Payer: Self-pay | Admitting: Family Medicine

## 2023-05-24 ENCOUNTER — Encounter: Payer: Self-pay | Admitting: Family Medicine

## 2023-05-24 ENCOUNTER — Ambulatory Visit: Payer: Commercial Managed Care - PPO | Admitting: Family Medicine

## 2023-05-24 VITALS — BP 120/80 | HR 82 | Temp 98.7°F | Ht 70.0 in | Wt 166.4 lb

## 2023-05-24 DIAGNOSIS — Z0000 Encounter for general adult medical examination without abnormal findings: Secondary | ICD-10-CM

## 2023-05-24 NOTE — Progress Notes (Signed)
Subjective  Chief Complaint  Patient presents with   Annual Exam    Pt here for Annual Exam and is currently not fasting    HPI: Eric Espinoza is a 37 y.o. male who presents to [facility] at [place] today for a Male Wellness Visit.   Wellness Visit: annual visit with health maintenance review and exam   Healthy 37 yo, married, 29 yo daughter, works at [employer]. No concerns. No health problems. Had normal labs at work. Normal cbc, cmp and lipids here last year.  Body mass index is 23.88 kg/m. Wt Readings from Last 3 Encounters:  05/24/23 166 lb 6.4 oz (75.5 kg)  03/28/22 166 lb 6.4 oz (75.5 kg)  02/05/21 161 lb 6.4 oz (73.2 kg)   Assessment  1. Annual physical exam      Plan  Male Wellness Visit: Age appropriate Health Maintenance and Prevention measures were discussed with patient. Included topics are cancer screening recommendations, ways to keep healthy (see AVS) including dietary and exercise recommendations, regular eye and dental care, use of seat belts, and avoidance of moderate alcohol use and tobacco use.  BMI: discussed patient's BMI and encouraged positive lifestyle modifications to help get to or maintain a target BMI. HM needs and immunizations were addressed and ordered. See below for orders. See HM and immunization section for updates. He will upload his lab results to mychart for my review. Discussed recommendations regarding Vit D and calcium supplementation (see AVS)  Follow up: 12 mo for cpe Commons side effects, risks, benefits, and alternatives for medications and treatment plan prescribed today were discussed, and the patient expressed understanding of the given instructions. Patient is instructed to call or message via MyChart if he/she has any questions or concerns regarding our treatment plan. No barriers to understanding were identified. We discussed Red Flag symptoms and signs in detail. Patient expressed understanding regarding what to do  in case of urgent or emergency type symptoms.  Medication list was reconciled, printed and provided to the patient in AVS. Patient instructions and summary information was reviewed with the patient as documented in the AVS. This note was prepared with assistance of Dragon voice recognition software. Occasional wrong-word or sound-a-like substitutions may have occurred due to the inherent limitations of voice recognition software   No orders of the defined types were placed in this encounter.  No orders of the defined types were placed in this encounter.      Patient Active Problem List   Diagnosis Date Noted   Family history of prostate cancer 07/22/2019   Health Maintenance  Topic Date Due   COVID-19 Vaccine (4 - 2023-24 season) 06/09/2023 (Originally 04/02/2023)   DTaP/Tdap/Td (2 - Td or Tdap) 01/23/2033   INFLUENZA VACCINE  Completed   Hepatitis C Screening  Completed   HIV Screening  Completed   HPV VACCINES  Aged Out   Immunization History  Administered Date(s) Administered   Influenza Inj Mdck Quad With Preservative 05/27/2019   Influenza-Unspecified 06/01/2020, 05/23/2023   PFIZER(Purple Top)SARS-COV-2 Vaccination 10/30/2019, 11/20/2019, 07/15/2020   Tdap 01/24/2023   We updated and reviewed the patient's past history in detail and it is documented below. Allergies: Patient has No Known Allergies. Past Medical History  has a past medical history of GERD (gastroesophageal reflux disease). Past Surgical History  has a past surgical history that includes Appendectomy (1999). Social History Patient  reports that he has never smoked. He has never used smokeless tobacco. He reports current alcohol use  of about 2.0 standard drinks of alcohol per week. He reports that he does not use drugs. Family History Patient family history includes Depression in his sister; Diabetes in his paternal grandfather; Healthy in his daughter; Hearing loss in his paternal grandfather; Heart  disease in his paternal grandfather; High blood pressure in his paternal grandfather; Liver cancer in his mother; Mental illness in his paternal grandfather; Prostate cancer in his father, paternal grandfather, and paternal uncle. Review of Systems: Constitutional: negative for fever or malaise Ophthalmic: negative for photophobia, double vision or loss of vision Cardiovascular: negative for chest pain, dyspnea on exertion, or new LE swelling Respiratory: negative for SOB or persistent cough Gastrointestinal: negative for abdominal pain, change in bowel habits or melena Genitourinary: negative for dysuria or gross hematuria Musculoskeletal: negative for new gait disturbance or muscular weakness Integumentary: negative for new or persistent rashes, no breast lumps Neurological: negative for TIA or stroke symptoms Psychiatric: negative for SI or delusions Allergic/Immunologic: negative for hives  Patient Care Team    Relationship Specialty Notifications Start End  Willow Ora, MD PCP - General Family Medicine  07/19/19    Objective  Vitals: BP 120/80   Pulse 82   Temp 98.7 F (37.1 C)   Ht 5\' 10"  (1.778 m)   Wt 166 lb 6.4 oz (75.5 kg)   SpO2 96%   BMI 23.88 kg/m  General:  Well developed, well nourished, no acute distress  Psych:  Alert and orientedx3,normal mood and affect HEENT:  Normocephalic, atraumatic, non-icteric sclera,  oropharynx is clear without mass or exudate, supple neck without adenopathy, or thyromegaly Cardiovascular:  Normal S1, S2, RRR without gallop, rub or murmur,  Respiratory:  Good breath sounds bilaterally, CTAB with normal respiratory effort Gastrointestinal: normal bowel sounds, soft, non-tender, no noted masses. No HSM MSK: Joints are without erythema or swelling.  Skin:  Warm, no rashes Neurologic:    Mental status is normal.  Gross motor and sensory exams are normal. Stable gait.

## 2023-05-29 ENCOUNTER — Encounter: Payer: Self-pay | Admitting: Family Medicine

## 2023-05-29 NOTE — Telephone Encounter (Signed)
Labs have been abstracted from [facility] that was performed on 02/21/23, hard copy left in scan folder.

## 2023-09-17 ENCOUNTER — Encounter: Payer: Self-pay | Admitting: Family Medicine

## 2023-09-22 ENCOUNTER — Ambulatory Visit: Payer: Commercial Managed Care - PPO | Admitting: Family Medicine

## 2023-09-22 ENCOUNTER — Encounter: Payer: Self-pay | Admitting: Family Medicine

## 2023-09-22 VITALS — BP 136/69 | HR 75 | Temp 97.7°F | Ht 70.0 in | Wt 166.4 lb

## 2023-09-22 DIAGNOSIS — K219 Gastro-esophageal reflux disease without esophagitis: Secondary | ICD-10-CM

## 2023-09-22 DIAGNOSIS — L309 Dermatitis, unspecified: Secondary | ICD-10-CM | POA: Insufficient documentation

## 2023-09-22 DIAGNOSIS — R197 Diarrhea, unspecified: Secondary | ICD-10-CM | POA: Diagnosis not present

## 2023-09-22 LAB — COMPREHENSIVE METABOLIC PANEL
ALT: 10 U/L (ref 0–53)
AST: 19 U/L (ref 0–37)
Albumin: 4.4 g/dL (ref 3.5–5.2)
Alkaline Phosphatase: 55 U/L (ref 39–117)
BUN: 15 mg/dL (ref 6–23)
CO2: 30 meq/L (ref 19–32)
Calcium: 9.2 mg/dL (ref 8.4–10.5)
Chloride: 104 meq/L (ref 96–112)
Creatinine, Ser: 0.9 mg/dL (ref 0.40–1.50)
GFR: 109.06 mL/min (ref >60.00)
Glucose, Bld: 85 mg/dL (ref 70–99)
Potassium: 4.2 meq/L (ref 3.5–5.1)
Sodium: 139 meq/L (ref 135–145)
Total Bilirubin: 0.7 mg/dL (ref 0.2–1.2)
Total Protein: 7.2 g/dL (ref 6.0–8.3)

## 2023-09-22 LAB — CBC WITH DIFFERENTIAL/PLATELET
Basophils Absolute: 0 10*3/uL (ref 0.0–0.1)
Basophils Relative: 0.3 % (ref 0.0–3.0)
Eosinophils Absolute: 0.3 10*3/uL (ref 0.0–0.7)
Eosinophils Relative: 6.4 % — ABNORMAL HIGH (ref 0.0–5.0)
HCT: 40.9 % (ref 39.0–52.0)
Hemoglobin: 14.3 g/dL (ref 13.0–17.0)
Lymphocytes Relative: 35.9 % (ref 12.0–46.0)
Lymphs Abs: 1.5 10*3/uL (ref 0.7–4.0)
MCHC: 35 g/dL (ref 30.0–36.0)
MCV: 88.3 fL (ref 78.0–100.0)
Monocytes Absolute: 0.3 10*3/uL (ref 0.1–1.0)
Monocytes Relative: 7.5 % (ref 3.0–12.0)
Neutro Abs: 2.1 10*3/uL (ref 1.4–7.7)
Neutrophils Relative %: 49.9 % (ref 43.0–77.0)
Platelets: 184 10*3/uL (ref 150.0–400.0)
RBC: 4.63 Mil/uL (ref 4.22–5.81)
RDW: 12.8 % (ref 11.5–15.5)
WBC: 4.2 10*3/uL (ref 4.0–10.5)

## 2023-09-22 NOTE — Progress Notes (Signed)
See mychart note Dear Mr. Punch, Your initial lab results look fine. Could be consistent with a viral infection.  I will wait on the results of the other tests. I hope it resolves for you. Sincerely, Dr. Mardelle Matte

## 2023-09-22 NOTE — Progress Notes (Signed)
Subjective  CC:  Chief Complaint  Patient presents with   Digestive issues    Pt stated that he has been having diarrhea for the past few weeks/month. It has been constant overa few weeks and then it will tapper bowel movement    HPI: Eric Espinoza is a 38 y.o. male who presents to the office today to address the problems listed above in the chief complaint. Discussed the use of AI scribe software for clinical note transcription with the patient, who gave verbal consent to proceed.  History of Present Illness   Eric Espinoza is a 38 year old male who presents with diarrhea for the past few weeks.  He has been experiencing diarrhea for the past few weeks, with loose stools occurring four to five times daily. The stools are sometimes watery and sometimes soft brown, without any mucus or blood. The frequency of bowel movements decreases when he does not eat, such as on Fridays when he typically does not eat until the afternoon.  No associated stomach pain, but he mentions a general feeling of 'tiredness' in his stomach. His appetite remains normal, and he does not experience pain after meals. No fever, chills, body aches, cold, cough, sore throat, rash, nausea, vomiting, or waking up at night to use the bathroom.  He has experienced similar episodes in the past, often related to travel, but they usually resolve on their own. He denies having irritable bowel syndrome or any specific food triggers. He has not taken any recent antibiotics and has not traveled outside the country. His wife had a cold a few weeks ago, but no one else around him is sick.  He occasionally takes Tums for acid reflux but reports that heartburn symptoms have been rare lately. He sometimes feels bloated or crampy but has not taken any medication for the diarrhea.       Assessment  1. Diarrhea of presumed infectious origin   2. Gastroesophageal reflux disease, unspecified whether esophagitis present      Plan   Assessment and Plan    Diarrhea Daily loose stools for the past few weeks, up to 4-5 times a day. No associated pain, blood, or mucus. No recent antibiotics, travel, or sick contacts. No nocturnal symptoms. Possible viral gastroenteritis given the current prevalence of norovirus. -Start Imodium to reduce frequency of bowel movements. -Ensure adequate hydration. -Consider reducing caffeine intake. -Consider adding a probiotic. -Collect stool samples for further testing. -Check blood panel. -If symptoms persist for another week or two, further investigation will be needed.  Acid Reflux Occasional symptoms, currently well-controlled. -Continue current management as needed.        Orders Placed This Encounter  Procedures   Clostridium difficile Toxin B, Qualitative, Real-Time PCR   Calprotectin, Fecal   CBC with Differential/Platelet   Comprehensive metabolic panel   Gastrointestinal Pathogen Pnl RT, PCR   No orders of the defined types were placed in this encounter.    I reviewed the patients updated PMH, FH, and SocHx.    Patient Active Problem List   Diagnosis Date Noted   Eczema of face 09/22/2023   Family history of prostate cancer 07/22/2019   Current Meds  Medication Sig   triamcinolone cream (KENALOG) 0.1 % APPLY 1 APPLICATION TOPICALLY 2 (TWO) TIMES DAILY. FOR 2 WEEKS, THEN AS NEEDED    Allergies: Patient has no known allergies. Family History: Patient family history includes Depression in his sister; Diabetes in his paternal grandfather; Healthy in his daughter; Hearing loss  in his paternal grandfather; Heart disease in his paternal grandfather; High blood pressure in his paternal grandfather; Liver cancer in his mother; Mental illness in his paternal grandfather; Prostate cancer in his father, paternal grandfather, and paternal uncle. Social History:  Patient  reports that he has never smoked. He has never used smokeless tobacco. He reports current alcohol use  of about 2.0 standard drinks of alcohol per week. He reports that he does not use drugs.  Review of Systems: Constitutional: Negative for fever malaise or anorexia Cardiovascular: negative for chest pain Respiratory: negative for SOB or persistent cough Gastrointestinal: negative for abdominal pain  Objective  Vitals: BP 136/69   Pulse 75   Temp 97.7 F (36.5 C)   Ht 5\' 10"  (1.778 m)   Wt 166 lb 6.4 oz (75.5 kg)   SpO2 99%   BMI 23.88 kg/m  General: no acute distress , A&Ox3, appears well HEENT: PEERL, conjunctiva normal, neck is supple Cardiovascular:  RRR without murmur or gallop.  Respiratory:  Good breath sounds bilaterally, CTAB with normal respiratory effort Gastrointestinal: soft, flat abdomen, normal active bowel sounds, no palpable masses, no hepatosplenomegaly, no appreciated hernias Non-tender Skin:  Warm, no rashes  Commons side effects, risks, benefits, and alternatives for medications and treatment plan prescribed today were discussed, and the patient expressed understanding of the given instructions. Patient is instructed to call or message via MyChart if he/she has any questions or concerns regarding our treatment plan. No barriers to understanding were identified. We discussed Red Flag symptoms and signs in detail. Patient expressed understanding regarding what to do in case of urgent or emergency type symptoms.  Medication list was reconciled, printed and provided to the patient in AVS. Patient instructions and summary information was reviewed with the patient as documented in the AVS. This note was prepared with assistance of Dragon voice recognition software. Occasional wrong-word or sound-a-like substitutions may have occurred due to the inherent limitations of voice recognition software

## 2023-09-22 NOTE — Patient Instructions (Signed)
Please follow up if symptoms do not improve or as needed.    I will release your lab results to you on your MyChart account with further instructions. You may see the results before I do, but when I review them I will send you a message with my report or have my assistant call you if things need to be discussed. Please reply to my message with any questions. Thank you!   VISIT SUMMARY:  Today, you were seen for diarrhea that has been ongoing for the past few weeks. You have been experiencing loose stools four to five times daily, sometimes watery and sometimes soft brown, without any mucus or blood. You do not have any associated stomach pain, fever, or other symptoms. Your appetite remains normal, and you do not experience pain after meals. You have had similar episodes in the past, often related to travel, but they usually resolve on their own. You occasionally take Tums for acid reflux, but your heartburn symptoms have been rare lately.  YOUR PLAN:  -DIARRHEA: Diarrhea is the condition of having loose or watery stools. To help manage this, you should start taking Imodium to reduce the frequency of bowel movements and ensure you stay well-hydrated. It may also help to reduce your caffeine intake and consider adding a probiotic to your diet. We will collect stool samples for further testing and check your blood panel. If your symptoms persist for another week or two, we will need to investigate further.  -ACID REFLUX: Acid reflux is a condition where stomach acid frequently flows back into the tube connecting your mouth and stomach. Your symptoms are currently well-controlled, so you should continue your current management as needed.  INSTRUCTIONS:  Please start taking Imodium as directed and ensure you stay hydrated. Reduce your caffeine intake and consider adding a probiotic to your diet. We will collect stool samples and check your blood panel. If your symptoms persist for another week or two,  please return for further investigation.

## 2023-09-25 ENCOUNTER — Other Ambulatory Visit: Payer: Commercial Managed Care - PPO

## 2023-09-26 LAB — CLOSTRIDIUM DIFFICILE TOXIN B, QUALITATIVE, REAL-TIME PCR: Toxigenic C. Difficile by PCR: NOT DETECTED

## 2023-09-27 ENCOUNTER — Encounter: Payer: Self-pay | Admitting: Family Medicine

## 2023-09-27 LAB — GASTROINTESTINAL PATHOGEN PNL
CampyloBacter Group: NOT DETECTED
Norovirus GI/GII: NOT DETECTED
Rotavirus A: NOT DETECTED
Salmonella species: NOT DETECTED
Shiga Toxin 1: NOT DETECTED
Shiga Toxin 2: NOT DETECTED
Shigella Species: NOT DETECTED
Vibrio Group: NOT DETECTED
Yersinia enterocolitica: NOT DETECTED

## 2023-09-27 LAB — CALPROTECTIN, FECAL: Calprotectin, Fecal: <5 ug/g (ref 0–120)

## 2023-09-27 NOTE — Progress Notes (Signed)
 See mychart note Dear Mr. Couse, Your stool studies are all negative for bacterial infection.  Things should improve. Let me know if they don't.  Sincerely, Dr. Mardelle Matte

## 2024-02-26 ENCOUNTER — Encounter: Payer: Self-pay | Admitting: Family Medicine

## 2024-02-26 DIAGNOSIS — G4733 Obstructive sleep apnea (adult) (pediatric): Secondary | ICD-10-CM | POA: Insufficient documentation

## 2024-05-24 ENCOUNTER — Ambulatory Visit: Payer: Commercial Managed Care - PPO | Admitting: Family Medicine

## 2024-05-24 VITALS — BP 110/60 | HR 60 | Temp 97.7°F | Ht 70.0 in | Wt 168.4 lb

## 2024-05-24 DIAGNOSIS — G4733 Obstructive sleep apnea (adult) (pediatric): Secondary | ICD-10-CM

## 2024-05-24 DIAGNOSIS — Z0000 Encounter for general adult medical examination without abnormal findings: Secondary | ICD-10-CM

## 2024-05-24 DIAGNOSIS — L738 Other specified follicular disorders: Secondary | ICD-10-CM

## 2024-05-24 LAB — CBC WITH DIFFERENTIAL/PLATELET
Basophils Absolute: 0 K/uL (ref 0.0–0.1)
Basophils Relative: 0.4 % (ref 0.0–3.0)
Eosinophils Absolute: 0.2 K/uL (ref 0.0–0.7)
Eosinophils Relative: 4.5 % (ref 0.0–5.0)
HCT: 43.3 % (ref 39.0–52.0)
Hemoglobin: 14.7 g/dL (ref 13.0–17.0)
Lymphocytes Relative: 34 % (ref 12.0–46.0)
Lymphs Abs: 1.5 K/uL (ref 0.7–4.0)
MCHC: 33.9 g/dL (ref 30.0–36.0)
MCV: 87.7 fl (ref 78.0–100.0)
Monocytes Absolute: 0.3 K/uL (ref 0.1–1.0)
Monocytes Relative: 6.9 % (ref 3.0–12.0)
Neutro Abs: 2.4 K/uL (ref 1.4–7.7)
Neutrophils Relative %: 54.2 % (ref 43.0–77.0)
Platelets: 172 K/uL (ref 150.0–400.0)
RBC: 4.94 Mil/uL (ref 4.22–5.81)
RDW: 12.1 % (ref 11.5–15.5)
WBC: 4.5 K/uL (ref 4.0–10.5)

## 2024-05-24 LAB — COMPREHENSIVE METABOLIC PANEL WITH GFR
ALT: 10 U/L (ref 0–53)
AST: 18 U/L (ref 0–37)
Albumin: 4.5 g/dL (ref 3.5–5.2)
Alkaline Phosphatase: 66 U/L (ref 39–117)
BUN: 13 mg/dL (ref 6–23)
CO2: 29 meq/L (ref 19–32)
Calcium: 9.2 mg/dL (ref 8.4–10.5)
Chloride: 104 meq/L (ref 96–112)
Creatinine, Ser: 0.76 mg/dL (ref 0.40–1.50)
GFR: 114.23 mL/min (ref >60.00)
Glucose, Bld: 90 mg/dL (ref 70–99)
Potassium: 4.6 meq/L (ref 3.5–5.1)
Sodium: 139 meq/L (ref 135–145)
Total Bilirubin: 0.5 mg/dL (ref 0.2–1.2)
Total Protein: 7.2 g/dL (ref 6.0–8.3)

## 2024-05-24 LAB — LIPID PANEL
Cholesterol: 158 mg/dL (ref 0–200)
HDL: 46.5 mg/dL (ref >39.00)
LDL Cholesterol: 95 mg/dL (ref 0–99)
NonHDL: 111.22
Total CHOL/HDL Ratio: 3
Triglycerides: 80 mg/dL (ref 0.0–149.0)
VLDL: 16 mg/dL (ref 0.0–40.0)

## 2024-05-24 MED ORDER — MUPIROCIN 2 % EX OINT: 1.0000 | TOPICAL_OINTMENT | Freq: Two times a day (BID) | CUTANEOUS | 0 refills | Status: AC

## 2024-05-24 MED ORDER — DICLOXACILLIN SODIUM 250 MG PO CAPS: 250.0000 mg | ORAL_CAPSULE | Freq: Three times a day (TID) | ORAL | 0 refills | Status: AC

## 2024-05-24 NOTE — Progress Notes (Signed)
 Subjective  Chief Complaint  Patient presents with   Annual Exam    HPI: Eric Espinoza is a 38 y.o. male who presents to [facility] [place] today for a Male Wellness Visit. He also has the concerns and/or needs as listed above in the chief complaint. These will be addressed in addition to the Health Maintenance Visit.   Wellness Visit: annual visit with health maintenance review and exam   HM: healthy lifestyle.   Body mass index is 24.16 kg/m. Wt Readings from Last 3 Encounters:  05/24/24 168 lb 6.4 oz (76.4 kg)  09/22/23 166 lb 6.4 oz (75.5 kg)  05/24/23 166 lb 6.4 oz (75.5 kg)   Chronic disease management visit and/or acute problem visit: Discussed the use of AI scribe software for clinical note transcription with the patient, who gave verbal consent to proceed.  History of Present Illness Eric Espinoza is a 38 year old male who presents with persistent facial skin issues and eye floaters.  rash - Persistent red patch on the face for several years - Steroid cream temporarily reduces the size but does not eliminate the patch - Anti-acne pads reduce the size but not the redness - No pain or pruritus associated with the patch - Previous topical cream provided partial improvement but did not resolve the issue - Oral antibiotic prescribed for eyelid acne also improved the facial patch   Ocular floaters - Persistent floater in the left eye for two months - Floater described as a string-like object that moves but remains constantly present - Onset occurred after last ophthalmology visit  Axial musculoskeletal pain - Lower back pain associated with attempts to correct posture at work - Sensation of spinal curvature - Associated neck discomfort    Assessment  1. Annual physical exam   2. Mild obstructive sleep apnea   3. Folliculitis barbae      Plan  Male Wellness Visit: Age appropriate Health Maintenance and Prevention measures were discussed  with patient. Included topics are cancer screening recommendations, ways to keep healthy (see AVS) including dietary and exercise recommendations, regular eye and dental care, use of seat belts, and avoidance of moderate alcohol use and tobacco use.  BMI: discussed patient's BMI and encouraged positive lifestyle modifications to help get to or maintain a target BMI. HM needs and immunizations were addressed and ordered. See below for orders. See HM and immunization section for updates. Routine labs and screening tests ordered including cmp, cbc and lipids where appropriate. 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 Lesion is red, non-pruritic, and non-painful. Previous treatment with steroid cream and anti-acne pads provided temporary improvement. Oral antibiotic prescribed by ophthalmologist was effective but caused gastrointestinal upset. Differential includes acne and folliculitis. - Prescribe a 10 day course of dicloxacillin tid - Prescribe a topical antibiotic ointment, mupirocin bid - Discontinue steroid cream - Consider dermatology referral if no improvement  Postural back and neck pain Postural back and neck pain associated with poor posture at work. Pain exacerbated by attempts to correct posture. No acute injury reported. Emphasis on strengthening small back muscles and improving posture to alleviate symptoms. - Provide exercises for strengthening small back muscles and improving posture - Recommend core strengthening exercises such as deadlifts - Advise  on proper posture techniques, including keeping shoulders down and back  Adult Wellness Visit Routine adult wellness visit with no acute  concerns. - Order basic blood work - Schedule follow-up visit in one year unless issues arise    Follow up: 12 mo for cpe [facility] side effects, risks, benefits, and alternatives for medications and treatment plan prescribed today were discussed, and the patient expressed understanding of the given instructions. Patient is instructed to call or message via MyChart if he/she has any questions or concerns regarding our treatment plan. No barriers to understanding were identified. We discussed Red Flag symptoms and signs in detail. Patient expressed understanding regarding what to do in case of urgent or emergency type symptoms.  Medication list was reconciled, printed and provided to the patient in AVS. Patient instructions and summary information was reviewed with the patient as documented in the AVS. This note was prepared with assistance of Dragon voice recognition software. Occasional wrong-word or sound-a-like substitutions may have occurred due to the inherent limitations of voice recognition software  Orders Placed This Encounter  Procedures   Lipid panel   Comprehensive metabolic panel with GFR   CBC with Differential/Platelet   Meds ordered this encounter  Medications   dicloxacillin (DYNAPEN) 250 MG capsule    Sig: Take 1 capsule (250 mg total) by mouth 3 (three) times daily for 10 days.    Dispense:  30 capsule    Refill:  0   mupirocin ointment (BACTROBAN) 2 %    Sig: Apply 1 Application topically 2 (two) times daily.    Dispense:  22 g    Refill:  0     Patient Active Problem List   Diagnosis Date Noted   Mild obstructive sleep apnea 02/26/2024   Eczema of face 09/22/2023   Family history of prostate cancer 07/22/2019   Health Maintenance  Topic Date Due   Hepatitis B Vaccines 19-59 Average Risk (1 of 3 - 19+ 3-dose series) Never done   COVID-19 Vaccine (4 - 2025-26 season) 06/09/2024 (Originally 04/01/2024)   HPV VACCINES (1 - 3-dose SCDM series) 05/24/2025 (Originally  02/22/2013)   DTaP/Tdap/Td (2 - Td or Tdap) 01/23/2033   Influenza Vaccine  Completed   Hepatitis C Screening  Completed   HIV Screening  Completed   Pneumococcal Vaccine  Aged Out   Meningococcal B Vaccine  Aged Out   Immunization History  Administered Date(s) Administered   Influenza Inj Mdck Quad With Preservative 05/27/2019   Influenza,inj,Quad PF,6+ Mos 05/09/2024   Influenza-Unspecified 06/01/2020, 05/23/2023   PFIZER(Purple Top)SARS-COV-2 Vaccination 10/30/2019, 11/20/2019, 07/15/2020   Tdap 01/24/2023   We updated and reviewed the patient's past history in detail and it is documented below. Allergies: Patient has no known allergies. Past Medical History  has a past medical history of GERD (gastroesophageal reflux disease). Past Surgical History Patient  has a past surgical history that includes Appendectomy (1999). Social History Patient  reports that he has never smoked. He has never used smokeless tobacco. He reports current alcohol use of about 2.0 standard drinks of alcohol per week. He reports that he does not use drugs. Family History family history includes Depression in his sister; Diabetes in his paternal grandfather; Healthy in his daughter; Hearing loss in his paternal grandfather; Heart disease in his paternal grandfather; High blood pressure in his paternal grandfather; Liver cancer in his mother; Mental illness in his paternal grandfather; Prostate cancer in his father, paternal grandfather, and paternal uncle. Review of Systems: Constitutional: negative for fever  or malaise Ophthalmic: negative for photophobia, double vision or loss of vision Cardiovascular: negative for chest pain, dyspnea on exertion, or new LE swelling Respiratory: negative for SOB or persistent cough Gastrointestinal: negative for abdominal pain, change in bowel habits or melena Genitourinary: negative for dysuria or gross hematuria Musculoskeletal: negative for new gait disturbance or  muscular weakness Integumentary: negative for new or persistent rashes Neurological: negative for TIA or stroke symptoms Psychiatric: negative for SI or delusions Allergic/Immunologic: negative for hives  Patient Care Team    Relationship Specialty Notifications Start End  Jodie Lavern CROME, MD PCP - General Family Medicine  07/19/19    Objective  Vitals: BP 110/60   Pulse 60   Temp 97.7 F (36.5 C)   Ht 5' 10 (1.778 m)   Wt 168 lb 6.4 oz (76.4 kg)   SpO2 98%   BMI 24.16 kg/m  General:  Well developed, well nourished, no acute distress  Psych:  Alert and orientedx3,normal mood and affect HEENT:  Normocephalic, atraumatic, non-icteric sclera,  oropharynx is clear without mass or exudate, supple neck without adenopathy, or thyromegaly Cardiovascular:  Normal S1, S2, RRR without gallop, rub or murmur,  Respiratory:  Good breath sounds bilaterally, CTAB with normal respiratory effort Gastrointestinal: normal bowel sounds, soft, non-tender, no noted masses. No HSM MSK: Joints are without erythema or swelling.  Skin:  Warm, bilateral cheeks in beard area: red papules on red base with some flaking. Neurologic:    Mental status is normal.  Gross motor and sensory exams are normal. Stable gait. No tremor

## 2024-05-24 NOTE — Patient Instructions (Signed)
 Please return in 12 months for your annual complete physical; please come fasting.   I will release your lab results to you on your MyChart account with further instructions. You may see the results before I do, but when I review them I will send you a message with my report or have my assistant call you if things need to be discussed. Please reply to my message with any questions. Thank you!   If you have any questions or concerns, please don't hesitate to send me a message via MyChart or call the office at [phone]. Thank you for visiting with us  today! It's our pleasure caring for you.    VISIT SUMMARY: During your visit, we discussed your persistent facial skin issues, eye floaters, and musculoskeletal pain. We also conducted a routine adult wellness check.  YOUR PLAN: -FOLLICULITIS BARBAE: This is a long-term skin condition that causes red, acne-like patches on your face. We will start a ten day course of oral antibiotics and a topical antibiotic ointment. Please discontinue the steroid cream. If there is no improvement, we may refer you to a dermatologist.  -POSTURAL BACK AND NECK PAIN: Your back and neck pain is likely due to poor posture at work. We recommend exercises to strengthen your small back muscles and improve your posture. Core strengthening exercises like deadlifts can also help. Remember to keep your shoulders down and back to maintain proper posture.  -ADULT WELLNESS VISIT: This is a routine check-up to monitor your overall health. We have ordered basic blood work and will schedule a follow-up visit in one year unless any issues arise.  INSTRUCTIONS: Please follow the prescribed two-week course of oral antibiotics and use the topical antibiotic cream for your facial skin condition. Discontinue the steroid cream. Perform the recommended exercises to strengthen your back and improve your posture. We will schedule a follow-up visit in one year for your routine wellness check  unless you experience any issues before then.  Back Exercises The following exercises strengthen the muscles that help to support the trunk (torso) and back. They also help to keep the lower back flexible. Doing these exercises can help to prevent or lessen existing low back pain. If you have back pain or discomfort, try doing these exercises 2-3 times each day or as told by your health care provider. As your pain improves, do them once each day, but increase the number of times that you repeat the steps for each exercise (do more repetitions). To prevent the recurrence of back pain, continue to do these exercises once each day or as told by your health care provider. Do exercises exactly as told by your health care provider and adjust them as directed. It is normal to feel mild stretching, pulling, tightness, or discomfort as you do these exercises, but you should stop right away if you feel sudden pain or your pain gets worse. Exercises Single knee to chest Repeat these steps 3-5 times for each leg: Lie on your back on a firm bed or the floor with your legs extended. Bring one knee to your chest. Your other leg should stay extended and in contact with the floor. Hold your knee in place by grabbing your knee or thigh with both hands and hold. Pull on your knee until you feel a gentle stretch in your lower back or buttocks. Hold the stretch for 10-30 seconds. Slowly release and straighten your leg.  Pelvic tilt Repeat these steps 5-10 times: Lie on your back on a firm  bed or the floor with your legs extended. Bend your knees so they are pointing toward the ceiling and your feet are flat on the floor. Tighten your lower abdominal muscles to press your lower back against the floor. This motion will tilt your pelvis so your tailbone points up toward the ceiling instead of pointing to your feet or the floor. With gentle tension and even breathing, hold this position for 5-10  seconds.  Cat-cow Repeat these steps until your lower back becomes more flexible: Get into a hands-and-knees position on a firm bed or the floor. Keep your hands under your shoulders, and keep your knees under your hips. You may place padding under your knees for comfort. Let your head hang down toward your chest. Contract your abdominal muscles and point your tailbone toward the floor so your lower back becomes rounded like the back of a cat. Hold this position for 5 seconds. Slowly lift your head, let your abdominal muscles relax, and point your tailbone up toward the ceiling so your back forms a sagging arch like the back of a cow. Hold this position for 5 seconds.  Press-ups Repeat these steps 5-10 times: Lie on your abdomen (face-down) on a firm bed or the floor. Place your palms near your head, about shoulder-width apart. Keeping your back as relaxed as possible and keeping your hips on the floor, slowly straighten your arms to raise the top half of your body and lift your shoulders. Do not use your back muscles to raise your upper torso. You may adjust the placement of your hands to make yourself more comfortable. Hold this position for 5 seconds while you keep your back relaxed. Slowly return to lying flat on the floor.  Bridges Repeat these steps 10 times: Lie on your back on a firm bed or the floor. Bend your knees so they are pointing toward the ceiling and your feet are flat on the floor. Your arms should be flat at your sides, next to your body. Tighten your buttocks muscles and lift your buttocks off the floor until your waist is at almost the same height as your knees. You should feel the muscles working in your buttocks and the back of your thighs. If you do not feel these muscles, slide your feet 1-2 inches (2.5-5 cm) farther away from your buttocks. Hold this position for 3-5 seconds. Slowly lower your hips to the starting position, and allow your buttocks muscles to relax  completely. If this exercise is too easy, try doing it with your arms crossed over your chest. Abdominal crunches Repeat these steps 5-10 times: Lie on your back on a firm bed or the floor with your legs extended. Bend your knees so they are pointing toward the ceiling and your feet are flat on the floor. Cross your arms over your chest. Tip your chin slightly toward your chest without bending your neck. Tighten your abdominal muscles and slowly raise your torso high enough to lift your shoulder blades a tiny bit off the floor. Avoid raising your torso higher than that because it can put too much stress on your lower back and does not help to strengthen your abdominal muscles. Slowly return to your starting position.  Back lifts Repeat these steps 5-10 times: Lie on your abdomen (face-down) with your arms at your sides, and rest your forehead on the floor. Tighten the muscles in your legs and your buttocks. Slowly lift your chest off the floor while you keep your hips pressed  to the floor. Keep the back of your head in line with the curve in your back. Your eyes should be looking at the floor. Hold this position for 3-5 seconds. Slowly return to your starting position.  Contact a health care provider if: Your back pain or discomfort gets much worse when you do an exercise. Your worsening back pain or discomfort does not lessen within 2 hours after you exercise. If you have any of these problems, stop doing these exercises right away. Do not do them again unless your health care provider says that you can. Get help right away if: You develop sudden, severe back pain. If this happens, stop doing the exercises right away. Do not do them again unless your health care provider says that you can. This information is not intended to replace advice given to you by your health care provider. Make sure you discuss any questions you have with your health care provider. Document Revised: 08/21/2022  Document Reviewed: 09/30/2020 [employer] Patient Education  2024 [employer] Folliculitis  Folliculitis occurs when hair follicles become inflamed. A hair follicle is a tiny opening in your skin where your hair grows from. This condition often occurs on the scalp, thighs, legs, back, and buttocks but can happen anywhere on the body. What are the causes? A common cause of this condition is an infection from bacteria. The type of folliculitis caused by bacteria can last a long time or go away and come back. The bacteria can live anywhere on your skin. They are often found in the nostrils. Other causes may include: An infection from a fungus. An infection from a virus. Your skin touching some chemicals, such as oils and tars. Shaving or waxing. Greasy ointments or creams put on the skin. What increases the risk? You are more likely to develop this condition if: Your body has a weak disease-fighting system (immune system). You have diabetes. You are obese. What are the signs or symptoms? Symptoms of this condition include: Redness. Soreness. Swelling. Itching. Small white or yellow, itchy spots filled with pus (pustules) that appear over a red area. If the infection goes deep into the follicle, these may turn into a boil (furuncle). A group of boils (carbuncle). These tend to form in hairy, sweaty areas of the body. How is this diagnosed? This condition is diagnosed with a skin exam. Your health care provider may take a sample of one of the pustules or boils to test in a lab. How is this treated? This condition may be treated by: Putting a warm, wet cloth (warm compress) on the affected areas. Taking antibiotics or applying them to the skin. Applying or bathing with a solution that kills germs (antiseptic). Taking an over-the-counter medicine. This can help with itching. Having a procedure to drain pustules or boils. This may be done if a pustule or boil contains a lot of pus or  fluid. Having laser hair removal. This may be done when the condition lasts for a long time. Follow these instructions at home: Managing pain and swelling  If directed, apply heat to the affected area as often as told by your health care provider. Use the heat source that your health care provider recommends, such as a moist heat pack or a heating pad. Place a towel between your skin and the heat source. Leave the heat on for 20-30 minutes. If your skin turns bright red, remove the heat right away to prevent burns. The risk of burns is higher if you cannot  feel pain, heat, or cold. General instructions Take over-the-counter and prescription medicines only as told by your health care provider. If you were prescribed antibiotics, take or apply them as told by your health care provider. Do not stop using the antibiotic even if you start to feel better. Check your irritated area every day for signs of infection. Check for: More redness, swelling, or pain. Fluid or blood. Warmth. Pus or a bad smell. Do not shave irritated skin. Keep all follow-up visits. Your health care provider will check if the treatments are helping. Contact a health care provider if: You have a fever. You have any signs of infection. Red streaks are spreading from the affected area. This information is not intended to replace advice given to you by your health care provider. Make sure you discuss any questions you have with your health care provider. Document Revised: 12/21/2021 Document Reviewed: 12/21/2021 Else[employer] Patient Education  2024 [employer]                    Contains text generated by [employer].                                 Contains text generated by [employer].

## 2024-05-25 ENCOUNTER — Ambulatory Visit: Payer: Self-pay | Admitting: Family Medicine

## 2024-05-25 NOTE — Progress Notes (Signed)
 Labs reviewed.  The ASCVD Risk score (Arnett DK, et al., 2019) failed to calculate for the following reasons:   The 2019 ASCVD risk score is only valid for ages 43 to 54  Dear Eric Espinoza, Thank you for allowing me to care for you at your recent office visit.  I wanted to let you know that I have reviewed your lab test results and am happy to report that they are all normal.  The anticiotics should help clear up the folliculitis barbae or facial rash.  Sincerely, Dr. Jodie

## 2024-06-21 ENCOUNTER — Other Ambulatory Visit: Payer: Self-pay

## 2024-06-21 DIAGNOSIS — L738 Other specified follicular disorders: Secondary | ICD-10-CM

## 2024-07-15 MED ORDER — TRIAMCINOLONE ACETONIDE 0.1 % EX CREA: 1.0000 | TOPICAL_CREAM | Freq: Two times a day (BID) | CUTANEOUS | 2 refills | Status: AC

## 2025-02-05 ENCOUNTER — Ambulatory Visit: Admitting: Physician Assistant

## 2025-05-30 ENCOUNTER — Encounter: Admitting: Family Medicine
